# Patient Record
Sex: Male | Born: 1940
Health system: Southern US, Community
[De-identification: ages and names within clinical notes are randomized; demographics above are authoritative.]

## PROBLEM LIST (undated history)

## (undated) DIAGNOSIS — Z860100 Personal history of colon polyps, unspecified: Secondary | ICD-10-CM

## (undated) DIAGNOSIS — C4490 Unspecified malignant neoplasm of skin, unspecified: Secondary | ICD-10-CM

## (undated) DIAGNOSIS — K449 Diaphragmatic hernia without obstruction or gangrene: Secondary | ICD-10-CM

## (undated) DIAGNOSIS — K5790 Diverticulosis of intestine, part unspecified, without perforation or abscess without bleeding: Secondary | ICD-10-CM

## (undated) DIAGNOSIS — I10 Essential (primary) hypertension: Secondary | ICD-10-CM

## (undated) DIAGNOSIS — K219 Gastro-esophageal reflux disease without esophagitis: Secondary | ICD-10-CM

## (undated) DIAGNOSIS — E119 Type 2 diabetes mellitus without complications: Secondary | ICD-10-CM

## (undated) DIAGNOSIS — M519 Unspecified thoracic, thoracolumbar and lumbosacral intervertebral disc disorder: Secondary | ICD-10-CM

## (undated) DIAGNOSIS — M1990 Unspecified osteoarthritis, unspecified site: Secondary | ICD-10-CM

## (undated) DIAGNOSIS — Z789 Other specified health status: Secondary | ICD-10-CM

## (undated) DIAGNOSIS — C801 Malignant (primary) neoplasm, unspecified: Secondary | ICD-10-CM

## (undated) DIAGNOSIS — C61 Malignant neoplasm of prostate: Secondary | ICD-10-CM

## (undated) DIAGNOSIS — E785 Hyperlipidemia, unspecified: Secondary | ICD-10-CM

## (undated) DIAGNOSIS — R1310 Dysphagia, unspecified: Secondary | ICD-10-CM

## (undated) HISTORY — DX: Malignant (primary) neoplasm, unspecified: C80.1

## (undated) HISTORY — DX: Hyperlipidemia, unspecified: E78.5

## (undated) HISTORY — PX: EYE SURGERY: SHX253

## (undated) HISTORY — DX: Gastro-esophageal reflux disease without esophagitis: K21.9

## (undated) HISTORY — PX: OTHER SURGICAL HISTORY: SHX169

## (undated) HISTORY — DX: Diaphragmatic hernia without obstruction or gangrene: K44.9

## (undated) HISTORY — PX: ESOPHAGEAL DILATION: SHX303

## (undated) HISTORY — DX: Type 2 diabetes mellitus without complications: E11.9

---
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
## 1999-04-08 ENCOUNTER — Encounter: Admission: RE | Admit: 1999-04-08 | Discharge: 1999-07-07 | Payer: Self-pay | Admitting: *Deleted

## 2003-03-14 ENCOUNTER — Encounter: Admission: RE | Admit: 2003-03-14 | Discharge: 2003-03-14 | Payer: Self-pay | Admitting: Family Medicine

## 2006-01-13 HISTORY — PX: HERNIA REPAIR: SHX51

## 2008-12-15 ENCOUNTER — Ambulatory Visit (HOSPITAL_COMMUNITY): Admission: RE | Admit: 2008-12-15 | Discharge: 2008-12-15 | Payer: Self-pay | Admitting: Gastroenterology

## 2009-08-21 ENCOUNTER — Encounter: Admission: RE | Admit: 2009-08-21 | Discharge: 2009-08-21 | Payer: Self-pay | Admitting: Family Medicine

## 2011-08-15 ENCOUNTER — Emergency Department (HOSPITAL_COMMUNITY): Payer: No Typology Code available for payment source

## 2011-08-15 ENCOUNTER — Inpatient Hospital Stay (HOSPITAL_COMMUNITY)
Admission: EM | Admit: 2011-08-15 | Discharge: 2011-08-17 | DRG: 200 | Disposition: A | Discharge status: Home / Self Care | Payer: No Typology Code available for payment source | Attending: General Surgery | Admitting: General Surgery

## 2011-08-15 ENCOUNTER — Encounter (HOSPITAL_COMMUNITY): Payer: Self-pay | Admitting: *Deleted

## 2011-08-15 DIAGNOSIS — E785 Hyperlipidemia, unspecified: Secondary | ICD-10-CM | POA: Diagnosis present

## 2011-08-15 DIAGNOSIS — Y92410 Unspecified street and highway as the place of occurrence of the external cause: Secondary | ICD-10-CM

## 2011-08-15 DIAGNOSIS — S32009A Unspecified fracture of unspecified lumbar vertebra, initial encounter for closed fracture: Secondary | ICD-10-CM

## 2011-08-15 DIAGNOSIS — S270XXA Traumatic pneumothorax, initial encounter: Principal | ICD-10-CM | POA: Diagnosis present

## 2011-08-15 DIAGNOSIS — S2239XA Fracture of one rib, unspecified side, initial encounter for closed fracture: Secondary | ICD-10-CM

## 2011-08-15 DIAGNOSIS — K219 Gastro-esophageal reflux disease without esophagitis: Secondary | ICD-10-CM | POA: Diagnosis present

## 2011-08-15 DIAGNOSIS — IMO0002 Reserved for concepts with insufficient information to code with codable children: Secondary | ICD-10-CM

## 2011-08-15 DIAGNOSIS — S2249XA Multiple fractures of ribs, unspecified side, initial encounter for closed fracture: Secondary | ICD-10-CM | POA: Diagnosis present

## 2011-08-15 DIAGNOSIS — S301XXA Contusion of abdominal wall, initial encounter: Secondary | ICD-10-CM | POA: Diagnosis present

## 2011-08-15 DIAGNOSIS — J939 Pneumothorax, unspecified: Secondary | ICD-10-CM

## 2011-08-15 DIAGNOSIS — Z9089 Acquired absence of other organs: Secondary | ICD-10-CM

## 2011-08-15 DIAGNOSIS — M549 Dorsalgia, unspecified: Secondary | ICD-10-CM

## 2011-08-15 DIAGNOSIS — T07XXXA Unspecified multiple injuries, initial encounter: Secondary | ICD-10-CM

## 2011-08-15 DIAGNOSIS — I10 Essential (primary) hypertension: Secondary | ICD-10-CM | POA: Diagnosis present

## 2011-08-15 HISTORY — DX: Other specified health status: Z78.9

## 2011-08-15 HISTORY — DX: Essential (primary) hypertension: I10

## 2011-08-15 LAB — COMPREHENSIVE METABOLIC PANEL
AST: 41 U/L — ABNORMAL HIGH (ref 0–37)
Alkaline Phosphatase: 52 U/L (ref 39–117)
BUN: 17 mg/dL (ref 6–23)
CO2: 22 mEq/L (ref 19–32)
Glucose, Bld: 146 mg/dL — ABNORMAL HIGH (ref 70–99)
Total Bilirubin: 1.4 mg/dL — ABNORMAL HIGH (ref 0.3–1.2)
Total Protein: 6.8 g/dL (ref 6.0–8.3)

## 2011-08-15 LAB — POCT I-STAT, CHEM 8
Calcium, Ion: 1.16 mmol/L (ref 1.13–1.30)
Glucose, Bld: 144 mg/dL — ABNORMAL HIGH (ref 70–99)
Potassium: 3.9 mEq/L (ref 3.5–5.1)
Sodium: 141 mEq/L (ref 135–145)
TCO2: 21 mmol/L (ref 0–100)

## 2011-08-15 LAB — CBC
MCH: 30.1 pg (ref 26.0–34.0)
MCHC: 35.7 g/dL (ref 30.0–36.0)
Platelets: 211 10*3/uL (ref 150–400)
RBC: 5.01 MIL/uL (ref 4.22–5.81)

## 2011-08-15 LAB — URINALYSIS, MICROSCOPIC ONLY
Glucose, UA: NEGATIVE mg/dL
Hgb urine dipstick: NEGATIVE
Specific Gravity, Urine: 1.022 (ref 1.005–1.030)

## 2011-08-15 LAB — SAMPLE TO BLOOD BANK

## 2011-08-15 LAB — PROTIME-INR: INR: 1.02 (ref 0.00–1.49)

## 2011-08-15 MED ORDER — ONDANSETRON HCL 4 MG/2ML IJ SOLN
INTRAMUSCULAR | Status: AC
  Administered 2011-08-15: 4 mg
  Filled 2011-08-15: qty 2

## 2011-08-15 MED ORDER — OXYCODONE-ACETAMINOPHEN 5-325 MG PO TABS: 2.0000 | ORAL_TABLET | ORAL | Status: DC | PRN

## 2011-08-15 MED ORDER — MORPHINE SULFATE 2 MG/ML IJ SOLN
2.0000 mg | INTRAMUSCULAR | Status: DC | PRN
  Administered 2011-08-15: 2 mg via INTRAVENOUS
  Filled 2011-08-15: qty 1

## 2011-08-15 MED ORDER — OXYCODONE-ACETAMINOPHEN 5-325 MG PO TABS: 1.0000 | ORAL_TABLET | Freq: Once | ORAL | Status: DC

## 2011-08-15 MED ORDER — SODIUM CHLORIDE 0.9 % IV BOLUS (SEPSIS)
1000.0000 mL | Freq: Once | INTRAVENOUS | Status: AC
  Administered 2011-08-15: 1000 mL via INTRAVENOUS

## 2011-08-15 MED ORDER — IOHEXOL 300 MG/ML  SOLN
100.0000 mL | Freq: Once | INTRAMUSCULAR | Status: AC | PRN
  Administered 2011-08-15: 100 mL via INTRAVENOUS

## 2011-08-15 MED ORDER — PANTOPRAZOLE SODIUM 40 MG PO TBEC
40.0000 mg | DELAYED_RELEASE_TABLET | Freq: Every day | ORAL | Status: DC
  Administered 2011-08-16 – 2011-08-17 (×2): 40 mg via ORAL
  Filled 2011-08-15 (×2): qty 1

## 2011-08-15 MED ORDER — SODIUM CHLORIDE 0.9 % IV SOLN
INTRAVENOUS | Status: DC
  Administered 2011-08-15: 21:00:00 via INTRAVENOUS
  Administered 2011-08-16: 50 mL/h via INTRAVENOUS
  Administered 2011-08-17: 03:00:00 via INTRAVENOUS

## 2011-08-15 MED ORDER — OXYCODONE HCL 5 MG PO TABS
5.0000 mg | ORAL_TABLET | ORAL | Status: DC | PRN
  Administered 2011-08-16 – 2011-08-17 (×8): 5 mg via ORAL
  Filled 2011-08-15 (×8): qty 1

## 2011-08-15 NOTE — ED Notes (Signed)
Removed from backboard via Dr. Nino Parsley.

## 2011-08-15 NOTE — ED Notes (Signed)
Pt ready for d/c stood up to use urinal felt dizzy assisted back to bed bp 86/62 iv placed bolus started liter NS Dr Rosalia Hammers at bedside Percocet on hold

## 2011-08-15 NOTE — ED Notes (Signed)
Patient transported to X-ray 

## 2011-08-15 NOTE — ED Notes (Signed)
Pt out of Ed with [facility].

## 2011-08-15 NOTE — ED Notes (Signed)
Large hematoma noted on rt flank painful to the touch

## 2011-08-15 NOTE — ED Notes (Signed)
Patient transported to CT 

## 2011-08-15 NOTE — ED Notes (Signed)
Surgery at bedside.
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
## 2011-08-15 NOTE — ED Notes (Signed)
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 [facility]. Pt awaiting transfer. Alert x 4 v/s stable. 1 of morphine given for pain will monitor.
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
## 2011-08-15 NOTE — Progress Notes (Signed)
MD notified that pt has arrived to the unit with bp 141/60 and informed of room number and that pt has only fluids and pain medication. Ilean Skill LPN

## 2011-08-15 NOTE — ED Notes (Signed)
Attempted to call report to [address]. Rn unavailable will continue to monitor.
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
## 2011-08-15 NOTE — ED Notes (Signed)
[facility] in house to transport pt to [facility].  

## 2011-08-16 ENCOUNTER — Inpatient Hospital Stay (HOSPITAL_COMMUNITY): Payer: No Typology Code available for payment source

## 2011-08-16 LAB — CBC WITH DIFFERENTIAL/PLATELET
Eosinophils Absolute: 0.1 10*3/uL (ref 0.0–0.7)
Eosinophils Relative: 1 % (ref 0–5)
HCT: 39.1 % (ref 39.0–52.0)
Hemoglobin: 13.6 g/dL (ref 13.0–17.0)
Lymphocytes Relative: 14 % (ref 12–46)
Lymphs Abs: 1.2 10*3/uL (ref 0.7–4.0)
MCH: 30.1 pg (ref 26.0–34.0)
MCV: 86.5 fL (ref 78.0–100.0)
Monocytes Absolute: 1.1 10*3/uL — ABNORMAL HIGH (ref 0.1–1.0)
Monocytes Relative: 12 % (ref 3–12)
RBC: 4.52 MIL/uL (ref 4.22–5.81)

## 2011-08-16 MED ORDER — BACITRACIN-NEOMYCIN-POLYMYXIN 400-5-5000 EX OINT
TOPICAL_OINTMENT | CUTANEOUS | Status: AC
  Filled 2011-08-16: qty 1

## 2011-08-16 MED ORDER — DOCUSATE SODIUM 100 MG PO CAPS
100.0000 mg | ORAL_CAPSULE | Freq: Two times a day (BID) | ORAL | Status: DC
  Administered 2011-08-16 – 2011-08-17 (×3): 100 mg via ORAL
  Filled 2011-08-16 (×3): qty 1
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
## 2011-08-17 LAB — CBC WITH DIFFERENTIAL/PLATELET
Basophils Absolute: 0 10*3/uL (ref 0.0–0.1)
Eosinophils Absolute: 0.1 10*3/uL (ref 0.0–0.7)
Eosinophils Relative: 1 % (ref 0–5)
HCT: 36.7 % — ABNORMAL LOW (ref 39.0–52.0)
Lymphs Abs: 1.3 10*3/uL (ref 0.7–4.0)
MCH: 29.6 pg (ref 26.0–34.0)
MCV: 86.8 fL (ref 78.0–100.0)
Monocytes Absolute: 1.3 10*3/uL — ABNORMAL HIGH (ref 0.1–1.0)
Platelets: 175 10*3/uL (ref 150–400)
RDW: 13.3 % (ref 11.5–15.5)

## 2011-08-17 MED ORDER — OXYCODONE-ACETAMINOPHEN 5-325 MG PO TABS: 2.0000 | ORAL_TABLET | ORAL | Status: DC | PRN

## 2011-08-17 MED ORDER — DSS 100 MG PO CAPS: 100.0000 mg | ORAL_CAPSULE | Freq: Two times a day (BID) | ORAL | Status: DC

## 2011-08-17 MED ORDER — BACITRACIN-NEOMYCIN-POLYMYXIN 400-5-5000 EX OINT
TOPICAL_OINTMENT | CUTANEOUS | Status: AC
  Filled 2011-08-17: qty 2
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
## 2011-08-19 ENCOUNTER — Telehealth: Payer: Self-pay | Admitting: Orthopedic Surgery

## 2011-08-19 NOTE — Telephone Encounter (Signed)
Confirmed appt on 8/15 @ 2:00.

## 2011-08-28 ENCOUNTER — Ambulatory Visit (INDEPENDENT_AMBULATORY_CARE_PROVIDER_SITE_OTHER): Payer: Medicare PPO | Admitting: Orthopedic Surgery

## 2011-08-28 ENCOUNTER — Encounter (INDEPENDENT_AMBULATORY_CARE_PROVIDER_SITE_OTHER): Payer: Self-pay

## 2011-08-28 VITALS — BP 124/68 | HR 70 | Temp 97.6°F | Resp 16 | Ht 70.0 in | Wt 243.5 lb

## 2011-08-28 DIAGNOSIS — S50819A Abrasion of unspecified forearm, initial encounter: Secondary | ICD-10-CM

## 2011-08-28 DIAGNOSIS — S2239XA Fracture of one rib, unspecified side, initial encounter for closed fracture: Secondary | ICD-10-CM

## 2011-08-28 DIAGNOSIS — IMO0002 Reserved for concepts with insufficient information to code with codable children: Secondary | ICD-10-CM

## 2011-08-28 NOTE — Patient Instructions (Signed)
You may increase activity as tolerated.  I would suggest increasing your ibuprofen to 800mg  three times daily with meals for 4 weeks.  Continue washing your abrasions in the shower with soap and water and applying antibiotic ointment twice daily and as needed to keep moist.
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
## 2013-03-18 ENCOUNTER — Encounter: Payer: Medicare FFS | Attending: Family Medicine

## 2013-03-18 VITALS — Ht 71.0 in | Wt 235.0 lb

## 2013-03-18 DIAGNOSIS — E119 Type 2 diabetes mellitus without complications: Secondary | ICD-10-CM

## 2013-03-18 DIAGNOSIS — Z713 Dietary counseling and surveillance: Secondary | ICD-10-CM | POA: Insufficient documentation

## 2013-03-21 NOTE — Progress Notes (Signed)
Patient was seen on 03/18/13 for the first of a series of three diabetes self-management courses at the [facility].  Current HbA1c: 7.2%  The following learning objectives were met by the patient during this class:  Describe diabetes  State some common risk factors for diabetes  Defines the role of glucose and insulin  Identifies type of diabetes and pathophysiology  Describe the relationship between diabetes and cardiovascular risk  State the members of the Healthcare Team  States the rationale for glucose monitoring  State when to test glucose  State their individual Target Range  State the importance of logging glucose readings  Describe how to interpret glucose readings  Identifies A1C target  Explain the correlation between A1c and eAG values  State symptoms and treatment of high blood glucose  State symptoms and treatment of low blood glucose  Explain proper technique for glucose testing  Identifies proper sharps disposal  Handouts given during class include:  Living Well with Diabetes book  Carb Counting and Meal Planning book  Meal Plan Card  Carbohydrate guide  Meal planning worksheet  Low Sodium Flavoring Tips  The diabetes portion plate  V4O to eAG Conversion Chart  Diabetes Medications  Diabetes Recommended Care Schedule  Support Group  Diabetes Success Plan  Core Class Satisfaction Survey  Follow-Up Plan:  Attend core 2

## 2013-03-25 ENCOUNTER — Ambulatory Visit: Payer: Self-pay

## 2013-04-01 DIAGNOSIS — E119 Type 2 diabetes mellitus without complications: Secondary | ICD-10-CM

## 2013-04-04 NOTE — Progress Notes (Signed)
Patient was seen on 3/10 for the third of a series of three diabetes self-management courses at the [facility]. The following learning objectives were met by the patient during this class:    State the amount of activity recommended for healthy living   Describe activities suitable for individual needs   Identify ways to regularly incorporate activity into daily life   Identify barriers to activity and ways to over come these barriers  Identify diabetes medications being personally used and their primary action for lowering glucose and possible side effects   Describe role of stress on blood glucose and develop strategies to address psychosocial issues   Identify diabetes complications and ways to prevent them  Explain how to manage diabetes during illness   Evaluate success in meeting personal goal   Establish 2-3 goals that they will plan to diligently work on until they return for the  2-monthfollow-up visit  Goals:  Follow Diabetes Meal Plan as instructed  Aim for 15-30 mins of physical activity daily as tolerated  Bring food record and glucose log to your follow up visit  Your patient has established the following 4 month goals in their individualized success plan: I will count my carb choices at most meals and snacks I will increase my activity level at least 2 days a week I will look at patterns in my record book daily  Lose more weight and increase activity  Your patient has identified these potential barriers to change:  None noted  Your patient has identified their diabetes self-care support plan as  [facility]  Spouse

## 2013-04-05 ENCOUNTER — Ambulatory Visit: Payer: Medicare Other

## 2013-04-07 DIAGNOSIS — E119 Type 2 diabetes mellitus without complications: Secondary | ICD-10-CM

## 2013-04-07 NOTE — Progress Notes (Signed)
Patient was seen on 04/07/13 for the second of a series of three diabetes self-management courses at the [facility]. The following learning objectives were met by the patient during this class:   Describe the role of different macronutrients on glucose  Explain how carbohydrates affect blood glucose  State what foods contain the most carbohydrates  Demonstrate carbohydrate counting  Demonstrate how to read Nutrition Facts food label  Describe effects of various fats on heart health  Describe the importance of good nutrition for health and healthy eating strategies  Describe techniques for managing your shopping, cooking and meal planning  List strategies to follow meal plan when dining out  Describe the effects of alcohol on glucose and how to use it safely  Goals:  Follow Diabetes Meal Plan as instructed  Eat 3 meals and 2 snacks, every 3-5 hrs  Aim for carbohydrate intake to 45-60 grams carbohydrate/meal Aim for carbohydrate intake to 15-30 grams carbohydrate/snack Add lean protein foods to meals/snacks  Monitor glucose levels as instructed by your doctor   Follow-Up Plan:  Attend Core 3  Work towards following your personal food plan.

## 2013-07-18 ENCOUNTER — Encounter: Payer: Medicare FFS | Attending: Family Medicine

## 2013-07-18 DIAGNOSIS — Z713 Dietary counseling and surveillance: Secondary | ICD-10-CM | POA: Insufficient documentation

## 2013-07-18 DIAGNOSIS — E118 Type 2 diabetes mellitus with unspecified complications: Secondary | ICD-10-CM | POA: Diagnosis not present

## 2013-07-18 NOTE — Progress Notes (Signed)
Patient was seen on 07/18/2013 for a review of the series of three diabetes self-management courses at the [facility]. The following learning objectives were met by the patient during this class:    Reviewed blood glucose monitoring and interpretation including the recommended target ranges and Hgb A1c.    Reviewed on carb counting, importance of regularly scheduled meals/snacks, and meal planning.    Reviewed the effects of physical activity on glucose levels and long-term glucose control.  Recommended goal of 150 minutes of physical activity/week.   Reviewed patient medications and discussed role of medication on blood glucose and possible side effects.   Discussed strategies to manage stress, psychosocial issues, and other obstacles to diabetes management.   Encouraged moderate weight reduction to improve glucose levels.     Reviewed short-term complications: hyper- and hypo-glycemia.  Discussed causes, symptoms, and treatment options.   Reviewed prevention, detection, and treatment of long-term complications.  Discussed the role of prolonged elevated glucose levels on body systems.  Goals:  Follow Diabetes Meal Plan as instructed  Eat 3 meals and 2 snacks, every 3-5 hrs  Limit carbohydrate intake to 45 grams carbohydrate/meal Limit carbohydrate intake to 15 grams carbohydrate/snack Add lean protein foods to meals/snacks  Monitor glucose levels as instructed by your doctor  Aim for goal of 15-30 mins of physical activity daily as tolerated  Bring food record and glucose log to your next nutrition visit   

## 2013-12-02 DIAGNOSIS — C61 Malignant neoplasm of prostate: Secondary | ICD-10-CM

## 2013-12-02 HISTORY — DX: Malignant neoplasm of prostate: C61

## 2013-12-02 HISTORY — PX: PROSTATE BIOPSY: SHX241

## 2013-12-12 ENCOUNTER — Other Ambulatory Visit (HOSPITAL_COMMUNITY): Payer: Self-pay | Admitting: Urology

## 2013-12-12 DIAGNOSIS — C61 Malignant neoplasm of prostate: Secondary | ICD-10-CM

## 2013-12-14 ENCOUNTER — Other Ambulatory Visit (HOSPITAL_COMMUNITY): Payer: Self-pay | Admitting: Urology

## 2013-12-14 DIAGNOSIS — C61 Malignant neoplasm of prostate: Secondary | ICD-10-CM

## 2013-12-15 ENCOUNTER — Encounter (HOSPITAL_COMMUNITY): Payer: Self-pay

## 2013-12-15 ENCOUNTER — Encounter (HOSPITAL_COMMUNITY)
Admission: RE | Admit: 2013-12-15 | Discharge: 2013-12-15 | Disposition: A | Discharge status: Home / Self Care | Payer: Medicare FFS | Source: Ambulatory Visit | Attending: Urology | Admitting: Urology

## 2013-12-15 DIAGNOSIS — M419 Scoliosis, unspecified: Secondary | ICD-10-CM | POA: Insufficient documentation

## 2013-12-15 DIAGNOSIS — C61 Malignant neoplasm of prostate: Secondary | ICD-10-CM | POA: Insufficient documentation

## 2013-12-15 MED ORDER — TECHNETIUM TC 99M MEDRONATE IV KIT
26.5000 | PACK | Freq: Once | INTRAVENOUS | Status: AC | PRN
  Administered 2013-12-15: 26.5 via INTRAVENOUS

## 2013-12-27 ENCOUNTER — Telehealth: Payer: Self-pay | Admitting: Medical Oncology

## 2013-12-27 NOTE — Telephone Encounter (Signed)
I called pt to introduce myself as the Prostate Nurse Navigator and the Coordinator of the [facility] [place].  1. I confirmed with the patient he is aware of his referral to the clinic and his appointment 01/03/14. He is to arrive at 12:30.  2. I discussed the format of the clinic and the physicians he will be seeing that day.  3. I discussed where the clinic is located and how to contact me.  4. I confirmed his address and informed him I would be mailing a packet of information and forms to be completed. I asked him to bring them with him the day of his appointment. I also asked the patient to eat some lunch before he comes due to the clinic time and amount of time he will be here.   He voiced understanding of the above. I asked him to call me if he has any questions or concerns regarding his appointments or the forms he needs to complete.   Cira Rue, RN, BSN, CRNI Prostate Nurse [place] Office: [phone] Fax: [phone]

## 2013-12-30 ENCOUNTER — Encounter: Payer: Self-pay | Admitting: Radiation Oncology

## 2013-12-30 NOTE — Progress Notes (Signed)
GU Location of Tumor / Histology: prostate adenocarcinoma  If Prostate Cancer, Gleason Score is (4 + 4) and PSA is (10.35 on 10/07/13)  Victor Goodman presented 2 months ago with signs/symptoms of: persistently elevated PSA of just over 10, urinary frequency, urgency, nocturia,  weak stream, ED  Biopsies of  prostate revealed:  12/02/13  Volume 76 gms   Past/Anticipated interventions by urology, if any: biopsy  Past/Anticipated interventions by medical oncology, if any: none , will be seen by Dr Alen Blew in prostate [place] today- 01/03/14  Weight changes, if any:   Bowel/Bladder complaints, if any:    Nausea/Vomiting, if any:   Pain issues, if any:    SAFETY ISSUES:  Prior radiation? no  Pacemaker/ICD? no  Possible current pregnancy? na  Is the patient on methotrexate? no  Current Complaints / other details:  Married, retired Office manager , 3 children

## 2014-01-02 ENCOUNTER — Telehealth: Payer: Self-pay | Admitting: Medical Oncology

## 2014-01-02 NOTE — Telephone Encounter (Signed)
I called pt to confirm his appointment for the [facility]  01/03/14 with arrival at 12:30pm. I asked him to bring his complete medical forms and to eat lunch. We reviewed where the cancer center is located and about  the [place] parking. I asked him to call me if he has any questions or concerns about his appointment tomorrow. He voiced understanding.       Cira Rue, RN, BSN, [place]  [phone]  Fax [phone]

## 2014-01-03 ENCOUNTER — Encounter: Payer: Self-pay | Admitting: Radiation Oncology

## 2014-01-03 ENCOUNTER — Encounter: Payer: Self-pay | Admitting: Medical Oncology

## 2014-01-03 ENCOUNTER — Encounter: Payer: Self-pay | Admitting: Specialist

## 2014-01-03 ENCOUNTER — Ambulatory Visit (HOSPITAL_BASED_OUTPATIENT_CLINIC_OR_DEPARTMENT_OTHER): Payer: Medicare FFS | Admitting: Oncology

## 2014-01-03 ENCOUNTER — Ambulatory Visit
Admission: RE | Admit: 2014-01-03 | Discharge: 2014-01-03 | Disposition: A | Discharge status: Home / Self Care | Payer: Medicare FFS | Source: Ambulatory Visit | Attending: Radiation Oncology | Admitting: Radiation Oncology

## 2014-01-03 VITALS — BP 138/71 | HR 87 | Temp 98.0°F | Resp 16 | Ht 71.0 in | Wt 249.7 lb

## 2014-01-03 DIAGNOSIS — E119 Type 2 diabetes mellitus without complications: Secondary | ICD-10-CM | POA: Diagnosis not present

## 2014-01-03 DIAGNOSIS — R599 Enlarged lymph nodes, unspecified: Secondary | ICD-10-CM | POA: Insufficient documentation

## 2014-01-03 DIAGNOSIS — Z86010 Personal history of colonic polyps: Secondary | ICD-10-CM | POA: Insufficient documentation

## 2014-01-03 DIAGNOSIS — R1310 Dysphagia, unspecified: Secondary | ICD-10-CM | POA: Diagnosis not present

## 2014-01-03 DIAGNOSIS — K449 Diaphragmatic hernia without obstruction or gangrene: Secondary | ICD-10-CM | POA: Insufficient documentation

## 2014-01-03 DIAGNOSIS — C61 Malignant neoplasm of prostate: Secondary | ICD-10-CM

## 2014-01-03 DIAGNOSIS — I10 Essential (primary) hypertension: Secondary | ICD-10-CM | POA: Insufficient documentation

## 2014-01-03 DIAGNOSIS — Z85828 Personal history of other malignant neoplasm of skin: Secondary | ICD-10-CM | POA: Diagnosis not present

## 2014-01-03 DIAGNOSIS — M1990 Unspecified osteoarthritis, unspecified site: Secondary | ICD-10-CM | POA: Insufficient documentation

## 2014-01-03 DIAGNOSIS — K219 Gastro-esophageal reflux disease without esophagitis: Secondary | ICD-10-CM | POA: Diagnosis not present

## 2014-01-03 DIAGNOSIS — Z79899 Other long term (current) drug therapy: Secondary | ICD-10-CM | POA: Diagnosis not present

## 2014-01-03 DIAGNOSIS — Z7982 Long term (current) use of aspirin: Secondary | ICD-10-CM | POA: Insufficient documentation

## 2014-01-03 DIAGNOSIS — K5790 Diverticulosis of intestine, part unspecified, without perforation or abscess without bleeding: Secondary | ICD-10-CM | POA: Insufficient documentation

## 2014-01-03 DIAGNOSIS — E785 Hyperlipidemia, unspecified: Secondary | ICD-10-CM | POA: Insufficient documentation

## 2014-01-03 DIAGNOSIS — R591 Generalized enlarged lymph nodes: Secondary | ICD-10-CM

## 2014-01-03 HISTORY — DX: Unspecified malignant neoplasm of skin, unspecified: C44.90

## 2014-01-03 HISTORY — DX: Unspecified thoracic, thoracolumbar and lumbosacral intervertebral disc disorder: M51.9

## 2014-01-03 HISTORY — DX: Diverticulosis of intestine, part unspecified, without perforation or abscess without bleeding: K57.90

## 2014-01-03 HISTORY — DX: Personal history of colon polyps, unspecified: Z86.0100

## 2014-01-03 HISTORY — DX: Malignant neoplasm of prostate: C61

## 2014-01-03 HISTORY — DX: Personal history of colonic polyps: Z86.010

## 2014-01-03 HISTORY — DX: Dysphagia, unspecified: R13.10

## 2014-01-03 HISTORY — DX: Unspecified osteoarthritis, unspecified site: M19.90

## 2014-01-03 NOTE — Progress Notes (Signed)
Patient reports leg pain and cramping. Patient wears glasses. Reports dribbling urine. Reports back pain. Denies hx of xrt. Denies having a pacemaker.

## 2014-01-03 NOTE — Consult Note (Signed)
Reason for Referral: Prostate cancer.  HPI: 73 year old man native of [place] where he lived the majority of his life. He has s a history of hypertension, GERD and obesity. He was found to have an elevated PSA in routine examination. His PSA was 10.35 and was referred to Dr. Risa Grill and underwent a prostate biopsy on 12/02/2013. That showed a Gleason score 4 + 4 = 8 in most out of 11/12 cores. There are few Gleason 7 and 6 patterns. His DRE was normal. Staging work up including a bone scan was normal. CT scan on 12/14/2013 showed possible EPE with subtle area in left side of the prostate. Multiple borderline enlarged lymph nodes noted in the pelvic area. The largest of which was around 1.8 cm. More extensive involvement in the internal iliac node area.   Clinically, He is asymptomatic. He reports no fevers, chils or sweats. No weight loss or apatite changes. He reports no headaches, blurry vision or seizures. He has no chest pain, palpitation or leg edema. No cough, no sob on activity. No wheezing or hemoptysis. He does not report any nausea, vomiting, abdominal pain or early satiety. He does report nocturia and frequent urination. He does not report any skeletal complaints. He does not report any arthralgias or myalgias. Rest of his review of systems unremarkable.   Past Medical History  Diagnosis Date  . No pertinent past medical history   . Hypertension   . GERD (gastroesophageal reflux disease)   . Hyperlipidemia   . Cancer     basal carcinoma  . Hiatal hernia   . Diabetes mellitus without complication   . Prostate cancer 12/02/13    Gleason 8, 11/12 cores positive, volume 76 gms   . Diverticulosis   . Dysphagia   . Arthritis   . Hx of colonic polyps   . Ruptured disk     hx  . Skin cancer 2010, 2014    non melanoma  :  Past Surgical History  Procedure Laterality Date  . Other surgical history  2011,2010    cataract surgery  . Appendectomy  1962  . Eye surgery  2009, 2010   cataract  . Hernia repair  2008    hiatal hernia  . Tonsillectomy  1947  . Prostate biopsy  12/02/13    Gleason 8, 11/12 cores positive, volume 76 gms  . Esophageal dilation    :  Current Outpatient Prescriptions  Medication Sig Dispense Refill  . aspirin 325 MG tablet Take 325 mg by mouth daily.    Marland Kitchen atorvastatin (LIPITOR) 20 MG tablet Take 20 mg by mouth daily.    . Cholecalciferol (VITAMIN D3) 2000 UNITS TABS Take by mouth.    Marland Kitchen ibuprofen (ADVIL,MOTRIN) 200 MG tablet Take 400 mg by mouth every 6 (six) hours as needed.    Marland Kitchen losartan-hydrochlorothiazide (HYZAAR) 100-12.5 MG per tablet Take 1 tablet by mouth daily.    . Multiple Vitamin (MULTIVITAMIN WITH MINERALS) TABS Take 1 tablet by mouth daily.    . Omega-3 Fatty Acids (FISH OIL) 1000 MG CPDR Take by mouth.    Marland Kitchen omeprazole (PRILOSEC) 40 MG capsule Take 40 mg by mouth daily.    . Selenium 200 MCG CAPS Take by mouth.    . tamsulosin (FLOMAX) 0.4 MG CAPS capsule Take 0.4 mg by mouth.    . zinc gluconate 50 MG tablet Take 50 mg by mouth daily.     No current facility-administered medications for this visit.  No Known Allergies:  Family History  Problem Relation Age of Onset  . Cancer Mother 62    bone  . Alzheimer's disease Father   :  History   Social History  . Marital Status: Married    Spouse Name: N/A    Number of Children: N/A  . Years of Education: N/A   Occupational History  . Not on file.   Social History Main Topics  . Smoking status: Never Smoker   . Smokeless tobacco: Never Used  . Alcohol Use: 0.6 oz/week    1 Glasses of wine per week     Comment: glass of red wine per day  . Drug Use: No  . Sexual Activity: No   Other Topics Concern  . Not on file   Social History Narrative  :  Pertinent items are noted in HPI.  Exam: ECOG 0 There were no vitals taken for this visit. General appearance: alert and cooperative Throat: lips, mucosa, and tongue normal; teeth and gums normal Neck:  no adenopathy Back: symmetric, no curvature. ROM normal. No CVA tenderness. Resp: clear to auscultation bilaterally Chest wall: no tenderness Cardio: regular rate and rhythm, S1, S2 normal, no murmur, click, rub or gallop GI: soft, non-tender; bowel sounds normal; no masses,  no organomegaly Extremities: extremities normal, atraumatic, no cyanosis or edema Skin: Skin color, texture, turgor normal. No rashes or lesions  CBC    Component Value Date/Time   WBC 9.9 08/17/2011 0550   RBC 4.23 08/17/2011 0550   HGB 12.5* 08/17/2011 0550   HCT 36.7* 08/17/2011 0550   PLT 175 08/17/2011 0550   MCV 86.8 08/17/2011 0550   MCH 29.6 08/17/2011 0550   MCHC 34.1 08/17/2011 0550   RDW 13.3 08/17/2011 0550   LYMPHSABS 1.3 08/17/2011 0550   MONOABS 1.3* 08/17/2011 0550   EOSABS 0.1 08/17/2011 0550   BASOSABS 0.0 08/17/2011 0550      Nm Bone Scan Whole Body  12/15/2013   CLINICAL DATA:  Prostate cancer.  EXAM: NUCLEAR MEDICINE WHOLE BODY BONE SCAN  TECHNIQUE: Whole body anterior and posterior images were obtained approximately 3 hours after intravenous injection of radiopharmaceutical.  RADIOPHARMACEUTICALS:  26.5 mCi Technetium-99 MDP  COMPARISON:  CT 12/15/2013.  FINDINGS: Bilateral renal function and excretion. Mild lumbar spine scoliosis noted. Minimal prominence of activity in thoracolumbar spine. Although subtle bony metastatic disease cannot be excluded, these changes are most likely degenerative. No other focal bony abnormality identified.  IMPRESSION: 1. Scoliosis lumbar spine. Mild prominence activity noted in the thoracolumbar spine. Although metastatic disease cannot be excluded. These changes are most likely degenerative. 2. No other focal abnormality noted suggest metastatic disease.   Electronically Signed   By: Marcello Moores  Register   On: 12/15/2013 11:57    Assessment and Plan:   73 year old gentleman with diagnosis of prostate cancer after presenting with a PSA of 10.38 and a biopsy  indicating a Gleason score of 4+4 = 8 and 11 out of 12 cores. He did have a tertiary patterns of 6 and 7. His imaging studies were reviewed today with radiology and appears to have possible extraprostatic extension and pelvic adenopathy. The natural course of this disease was discussed today with the patient and his wife after discussion in the prostate cancer multidisciplinary clinic. It is felt that he has advanced disease with pelvic adenopathy and high risk Gleason score. Options of treatment were discussed today which include long-term androgen deprivation and certainly consideration to add radiation therapy. Given his age, his  performance status is reasonable to consider that option and give him a potential curative possibility. He understands the likelihood of cure is slim but certainly worth the attempt at this time.  I discussed with him the role of systemic chemotherapy especially in the setting of pelvic adenopathy. At this time, I feel chemotherapy will is rather marginal in the setting of non-bulky pelvic adenopathy. The risks and benefits were discussed extensively and is felt the risks do not justify the marginal benefit at this time.  All his questions were answered today he is agreeable with long-term androgen deprivation and possibly radiation therapy.

## 2014-01-03 NOTE — Progress Notes (Signed)
Please see consult note.  

## 2014-01-03 NOTE — Progress Notes (Signed)
Radiation Oncology         [phone] ________________________________  Multidisciplinary Prostate Cancer Clinic  Initial Radiation Oncology Consultation  Name: Victor Goodman MRN: [id number]  Date: 01/03/2014  DOB: September 05, 1940  XL:[id number],VOZDGU Victor Sers, MD  Victor Bring, MD   REFERRING PHYSICIAN: [name], MD  DIAGNOSIS: 73 y.o. gentleman with stage T1c adenocarcinoma of the prostate with a Gleason's score of 4+4 and a PSA of 10.35    ICD-9-CM ICD-10-CM   1. Malignant neoplasm of prostate [name] is a 73 y.o. gentleman.  He was noted to have an elevated PSA of 10.35 by his primary care physician, Dr. Laurann Goodman.  Accordingly, he was referred for evaluation in urology by Dr. Risa Goodman and  digital rectal examination was performed at that time revealing no nodule or abnormality.  The patient proceeded to transrectal ultrasound with 12 biopsies of the prostate on 12/02/13.  The prostate volume measured 76 cc.  Out of 12 core biopsies, 11 were positive.  The maximum Gleason score was 4+4.  The patient reviewed the biopsy results with his urologist.  He subsequently underwent bone scan and pelvic CT for staging purposes. The bone scan showed no bone metastases. The pelvic CT did show enlarged lymphadenopathy within the pelvis extending up to the level of the confluence of the internal and external iliac veins on the right.  He reviewed the staging workup with urology and has kindly been referred today to the multidisciplinary prostate cancer clinic for presentation of pathology and radiology studies in our conference for discussion of potential radiation treatment options and clinical evaluation.    PREVIOUS RADIATION THERAPY: No  PAST MEDICAL HISTORY:  has a past medical history of No pertinent past medical history; Hypertension; GERD (gastroesophageal reflux disease); Hyperlipidemia; Cancer; Hiatal hernia; Diabetes mellitus without  complication; Prostate cancer (12/02/13); Diverticulosis; Dysphagia; Arthritis; colonic polyps; Ruptured disk; and Skin cancer (2010, 2014).    PAST SURGICAL HISTORY: Past Surgical History  Procedure Laterality Date  . Other surgical history  2011,2010    cataract surgery  . Appendectomy  1962  . Eye surgery  2009, 2010    cataract  . Hernia repair  2008    hiatal hernia  . Tonsillectomy  1947  . Prostate biopsy  12/02/13    Gleason 8, 11/12 cores positive, volume 76 gms  . Esophageal dilation      FAMILY HISTORY: family history includes Alzheimer's disease in his father; Cancer (age of onset: 49) in his mother.  SOCIAL HISTORY:  reports that he has never smoked. He has never used smokeless tobacco. He reports that he drinks about 0.6 oz of alcohol per week. He reports that he does not use illicit drugs.  ALLERGIES: Review of patient's allergies indicates no known allergies.  MEDICATIONS:  Current Outpatient Prescriptions  Medication Sig Dispense Refill  . aspirin 325 MG tablet Take 325 mg by mouth daily.    Marland Kitchen atorvastatin (LIPITOR) 20 MG tablet Take 20 mg by mouth daily.    . Cholecalciferol (VITAMIN D3) 2000 UNITS TABS Take by mouth.    Marland Kitchen ibuprofen (ADVIL,MOTRIN) 200 MG tablet Take 400 mg by mouth every 6 (six) hours as needed.    Marland Kitchen losartan-hydrochlorothiazide (HYZAAR) 100-12.5 MG per tablet Take 1 tablet by mouth daily.    . Multiple Vitamin (MULTIVITAMIN WITH MINERALS) TABS Take 1 tablet by mouth daily.    . Omega-3 Fatty Acids (FISH OIL) 1000 MG CPDR Take by mouth.    Marland Kitchen  omeprazole (PRILOSEC) 40 MG capsule Take 40 mg by mouth daily.    . Selenium 200 MCG CAPS Take by mouth.    . zinc gluconate 50 MG tablet Take 50 mg by mouth daily.     No current facility-administered medications for this encounter.    REVIEW OF SYSTEMS:  A 15 point review of systems is documented in the electronic medical record. This was obtained by the nursing staff. However, I reviewed this with the  patient to discuss relevant findings and make appropriate changes.  A comprehensive review of systems was negative..  The patient completed an IPSS and IIEF questionnaire.     PHYSICAL EXAM: This patient is in no acute distress.  He is alert and oriented.   height is 5\' 11"  (1.803 m) and weight is 249 lb 11.2 oz (113.263 kg). His oral temperature is 98 F (36.7 C). His blood pressure is 138/71 and his pulse is 87. His respiration is 16 and oxygen saturation is 100%.  He exhibits no respiratory distress or labored breathing.  He appears neurologically intact.  His mood is pleasant.  His affect is appropriate.  Please note the digital rectal exam findings described above.  KPS = 100  100 - Normal; no complaints; no evidence of disease. 90   - Able to carry on normal activity; minor signs or symptoms of disease. 80   - Normal activity with effort; some signs or symptoms of disease. 77   - Cares for self; unable to carry on normal activity or to do active work. 60   - Requires occasional assistance, but is able to care for most of his personal needs. 50   - Requires considerable assistance and frequent medical care. 14   - Disabled; requires special care and assistance. 13   - Severely disabled; hospital admission is indicated although death not imminent. 73   - Very sick; hospital admission necessary; active supportive treatment necessary. 10   - Moribund; fatal processes progressing rapidly. 0     - Dead  Karnofsky DA, Abelmann [place], Craver LS and Burchenal [facility] [phone]) The use of the nitrogen mustards in the palliative treatment of carcinoma: with particular reference to bronchogenic carcinoma Cancer 1 634-56   LABORATORY DATA:  Lab Results  Component Value Date   WBC 9.9 08/17/2011   HGB 12.5* 08/17/2011   HCT 36.7* 08/17/2011   MCV 86.8 08/17/2011   PLT 175 08/17/2011   Lab Results  Component Value Date   NA 141 08/15/2011   K 3.9 08/15/2011   CL 107 08/15/2011   CO2 22 08/15/2011    Lab Results  Component Value Date   ALT 51 08/15/2011   AST 41* 08/15/2011   ALKPHOS 52 08/15/2011   BILITOT 1.4* 08/15/2011     RADIOGRAPHY: Nm Bone Scan Whole Body  12/15/2013   CLINICAL DATA:  Prostate cancer.  EXAM: NUCLEAR MEDICINE WHOLE BODY BONE SCAN  TECHNIQUE: Whole body anterior and posterior images were obtained approximately 3 hours after intravenous injection of radiopharmaceutical.  RADIOPHARMACEUTICALS:  26.5 mCi Technetium-99 MDP  COMPARISON:  CT 12/15/2013.  FINDINGS: Bilateral renal function and excretion. Mild lumbar spine scoliosis noted. Minimal prominence of activity in thoracolumbar spine. Although subtle bony metastatic disease cannot be excluded, these changes are most likely degenerative. No other focal bony abnormality identified.  IMPRESSION: 1. Scoliosis lumbar spine. Mild prominence activity noted in the thoracolumbar spine. Although metastatic disease cannot be excluded. These changes are most likely degenerative. 2. No other focal  abnormality noted suggest metastatic disease.   Electronically Signed   By: Marcello Moores  Register   On: 12/15/2013 11:57      IMPRESSION: This gentleman is a 73 y.o. gentleman with stage T1c adenocarcinoma of the prostate with a Gleason's score of 4+4 and a PSA of 10.35.  His T-Stage, Gleason's Score, and PSA put him into the high risk group.  Accordingly he is eligible for a variety of potential treatment options including 2-3 years of androgen deprivation with radiotherapy to the pelvis versus indefinite androgen deprivation alone.  PLAN:Today I reviewed the findings and workup thus far.  We discussed the natural history of prostate cancer.  We reviewed the the implications of T-stage, Gleason's Score, and PSA on decision-making and outcomes in prostate cancer.  We discussed radiation treatment in the management of prostate cancer with regard to the logistics and delivery of external beam radiation treatment as well as the logistics and  delivery of prostate brachytherapy.  We compared and contrasted each of these approaches and also compared these against prostatectomy.  The patient expressed interest in external beam radiotherapy.  I filled out a patient counseling form for him with relevant treatment diagrams and we retained a copy for our records.   The patient would like to proceed with androgen deprivation. If he elects to proceed with pelvic radiotherapy, I will move forward with scheduling placement of three gold fiducial markers into the prostate to proceed with IMRT 2-3 months after the initiation of androgen deprivation.     I enjoyed meeting with him today, and will look forward to participating in the care of this very nice gentleman.  I spent 60 minutes face to face with the patient and more than 50% of that time was spent in counseling and/or coordination of care.   ------------------------------------------------  Sheral Apley. Tammi Klippel, M.D.

## 2014-01-03 NOTE — Progress Notes (Signed)
[place] Psychosocial Distress Screening Clinical Social Work Chaplain met patient in multidisciplinary prostate clinic and assessed distress by distress screening protocol.  The patient scored a 3 on the Psychosocial Distress Thermometer which indicates mild distress.   Clinical Social Worker follow up needed: No.  Patient's main concern was being aware of his mortality. We spent quite a while talking about this and about how to live fully each day--because we never know how long we have to live. Helped him dream a little about what he would like to do and to create a plan to keep him focused on the present. He is a man of faith. I helped him think about how his faith might impact his living. I also gave him information about the [place] and services, including the Prostate Support Group. Gave him my contact information if he has further questions.  Epifania Gore, PhD, [facility] Chaplain   [facility] DISTRESS SCREENING 01/03/2014  Screening Type Initial Screening  Distress experienced in past week (1-10) 3  Spiritual/Religous concerns type Facing my mortality  Physician notified of physical symptoms Yes  Referral to clinical psychology No  Referral to clinical social work No  Referral to dietition No  Referral to financial advocate No  Referral to support programs Yes  Referral to palliative care No

## 2014-01-03 NOTE — Progress Notes (Unsigned)
                               Care Plan Summary  Name: Dr. Jearld Pies DOB: 06-Oct-1940   Your Medical Team:   Urologist -  Dr. [name], [facility]  Radiation Oncologist - Dr.Matthew Micki [facility]   Medical Oncologist - Dr. Zola Button, [place]  Recommendations: 1) Hormone Therapy  2) Radiation    * These recommendations are based on information available as of today's consult.      Recommendations may change depending on the results of further tests or exams. Next Steps: 1) Dr. Cy Blamer office will call you for an appointment for hormone therapy and gold seeds.  2) After seed implants Dr. Johny Shears office will set up radiation appointments.  When appointments need to be scheduled, you will be contacted by [facility] and/or [facility].  Questions?  Please do not hesitate to call Cira Rue, RN, BSN, CRNI at [phone] any questions or concerns.  Shirlean Mylar is your Oncology Nurse Navigator and is available to assist you while you're receiving your medical care at [facility].

## 2014-01-09 ENCOUNTER — Encounter: Payer: Self-pay | Admitting: Medical Oncology

## 2014-01-09 ENCOUNTER — Telehealth: Payer: Self-pay | Admitting: Oncology

## 2014-01-09 ENCOUNTER — Other Ambulatory Visit: Payer: Self-pay | Admitting: Medical Oncology

## 2014-01-09 NOTE — [facility] Oncology Navigator Note (Signed)
Patient seen in Prostate [facility] clinic 01/03/14. During our post clinic visit he voiced concern about weight gain with hormone therapy. He states he already needs to loose a few pounds. I informed him I can set him up an appointment with Dory Peru for a dietary consult. He would like an appointment. He and his wife also asked about updating their advanced directives. I gave them one of our packets and I explained that can meet with [name] the social worker the same day they come for the dietary consult. They voiced understanding and were pleased. I reviewed my office number and stressed to them to call me with any questions or concerns. I spoke with Dory Peru and she is aware of the referral and POF made.

## 2014-01-19 ENCOUNTER — Encounter: Payer: Self-pay | Admitting: *Deleted

## 2014-01-19 ENCOUNTER — Ambulatory Visit: Payer: Medicare FFS | Admitting: Nutrition

## 2014-01-19 NOTE — Progress Notes (Signed)
74 year old male diagnosed with prostate cancer.  He is a patient of Dr. Alen Blew.  Past medical history includes hypertension, GERD, hyperlipidemia, hiatal hernia, diabetes, diverticulosis, and dysphasia.  Medications include Lipitor, vitamin D3, multivitamin, omega-3 fatty acids, Prilosec, selenium, zinc.  Labs were reviewed.  Height: 5 feet 11 inches. Weight: 249.7 pounds. Usual body weight: 235 pounds. BMI: 34.84.  Patient reports he is starting androgen deprivation therapy.  He is concerned with weight gain during treatment.  Reports he was diagnosed as a borderline diabetic and completed diabetic education.  However, he does not really follow a diabetic diet nor does he check blood sugars.  Patient reports blood sugars have been within a normal range at M.D. office.  Currently patient is not exercising however he has a bicycle and he enjoys walking.  Nutrition diagnosis: Food and nutrition related knowledge deficit related to new diagnosis of prostate cancer and associated treatments as evidenced by no prior need for nutrition related information.  Intervention: Patient educated to increase plant-based foods and strive for 5-7 servings of vegetables and whole fruits daily. Recommended patient increase plant-based proteins. Recommended including fatty fish twice a week. Encouraged foods with lycopene. Recommended low-fat diet. Reviewed exercise recommendations with patient. Provided fact sheets.  Questions were answered.  Teach back method used.  Contact information given.  Monitoring, evaluation, goals: Patient will follow a plant-based diet with controlled calories to promote safe weight loss.  He will begin exercise regimen of his choice and increase total exercise to 30 minutes 5 days weekly.  Nutrition diagnosis resolved.  No follow-up required.  **Disclaimer: This note was dictated with voice recognition software. Similar sounding words can inadvertently be transcribed and this  note may contain transcription errors which may not have been corrected upon publication of note.**

## 2014-01-19 NOTE — Progress Notes (Signed)
[place] Social Work  Clinical Social Work was referred by prostate navigator to review and complete healthcare advance directives.  Clinical Social Worker met with patient and patient's spouse in [place] office.  The patient designated spouse Victor Goodman as their primary healthcare agent and Victor Goodman as their secondary agent.  Patient also completed healthcare living will.    Clinical Social Worker notarized documents and made copies for patient/family. Clinical Social Worker will send documents to medical records to be scanned into patient's chart. Clinical Social Worker encouraged patient/family to contact with any additional questions or concerns.  [name], MSW, [place] Worker [facility] [phone]

## 2014-02-08 ENCOUNTER — Encounter: Payer: Self-pay | Admitting: Medical Oncology

## 2014-02-08 NOTE — [facility] Oncology Navigator Note (Signed)
I called Mr. Wimbush to see how he is doing with his hormone therapy and the possible start date of radiation. He states that he will get his 2nd hormone shot tomorrow. He states so far so good. He has had some hot flashes but his energy remains good. His understanding is after he gets the third injection he will get the gold makers placed and start the radiation. I will continue to follow his progress and asked him to call me with any questions or concerns. He voiced understanding.    Cira Rue, RN, BSN, [place]  [phone] Fax [phone]

## 2014-03-14 ENCOUNTER — Telehealth: Payer: Self-pay | Admitting: Medical Oncology

## 2014-03-14 ENCOUNTER — Telehealth: Payer: Self-pay | Admitting: *Deleted

## 2014-03-14 NOTE — Telephone Encounter (Signed)
I followed up with Victor Goodman to see how he is doing and if he is ready to proceed with radiation. He stated he has received his third ADT injection. I asked if he would like to come back in and meet with Dr. Tammi Klippel to further discuss or if he would like to proceed. He voiced he was ready to get started. We discussed he will need to get gold markers placed and then simulation. I discussed what takes place during simulation. He voiced understanding. I asked him to call me if he has questions or concerns. This information was forwarded to Dr. Tammi Klippel.

## 2014-03-14 NOTE — Telephone Encounter (Signed)
Called patient to inform of gold seed placement on 04-27-14 @ 9:30 am @ Dr. Cy Blamer Office and his sim on 05-05-14 @ 11 am @ Dr. Johny Shears Office, spoke with patient and he is aware of these appts.

## 2014-04-04 ENCOUNTER — Telehealth: Payer: Self-pay | Admitting: *Deleted

## 2014-04-04 NOTE — Telephone Encounter (Signed)
Called patient to inform of sim appt. Being moved to 05-05-14 @ 2 pm, due to Dr. Tammi Klippel being in the OR, lvm for a return call

## 2014-04-17 ENCOUNTER — Telehealth: Payer: Self-pay | Admitting: Medical Oncology

## 2014-04-17 NOTE — Telephone Encounter (Signed)
Julie-wife called stating she is concerned that it is taking so long to start radiation. I explained that he had to receive the  androgen deprivation therapy for 3 months before he starts the radiation. Pt is aware that he will be simulated 4/22. I explained what takes place when he gets simulated and then his treatments will start 7-10 days after. She also states he saw his primary care Dr. Laurann Montana and she has not received any notes. I will fax records to Dr. Laurann Montana. She also had some questions about other problems  that are listed on My Chart. We discussed these issues which are related to his diabetes and  labs. She states she was concerned about the delay but now understands the process much better. I asked her to call me any time with questions and concerns. I plan to see him the day of simulation.    Cira Rue, RN, BSN, [place]  [phone] Fax [phone]

## 2014-05-05 ENCOUNTER — Ambulatory Visit
Admission: RE | Admit: 2014-05-05 | Discharge: 2014-05-05 | Disposition: A | Discharge status: Home / Self Care | Payer: Medicare PPO | Source: Ambulatory Visit | Attending: Radiation Oncology | Admitting: Radiation Oncology

## 2014-05-05 DIAGNOSIS — C61 Malignant neoplasm of prostate: Secondary | ICD-10-CM | POA: Diagnosis not present

## 2014-05-05 NOTE — Progress Notes (Signed)
  Radiation Oncology         [phone]) [phone] ________________________________  Name: Victor Goodman MRN: [id number]  Date: 05/05/2014  DOB: 23-May-1940  SIMULATION AND TREATMENT PLANNING NOTE    ICD-9-CM ICD-10-CM   1. Malignant neoplasm of prostate 185 C61     DIAGNOSIS:  74 y.o. gentleman with stage T1c adenocarcinoma of the prostate with a Gleason's score of 4+4 and a PSA of 10.35  NARRATIVE:  The patient was brought to the [facility].  Identity was confirmed.  All relevant records and images related to the planned course of therapy were reviewed.  The patient freely provided informed written consent to proceed with treatment after reviewing the details related to the planned course of therapy. The consent form was witnessed and verified by the simulation staff.  Then, the patient was set-up in a stable reproducible supine position for radiation therapy.  A vacuum lock pillow device was custom fabricated to position his legs in a reproducible immobilized position.  Then, I performed a urethrogram under sterile conditions to identify the prostatic apex.  CT images were obtained.  Surface markings were placed.  The CT images were loaded into the planning software.  Then the prostate target and avoidance structures including the rectum, bladder, bowel and hips were contoured.  Treatment planning then occurred.  The radiation prescription was entered and confirmed.  A total of 1 complex treatment devices were fabricated. I have requested : Intensity Modulated Radiotherapy (IMRT) is medically necessary for this case for the following reason:  Rectal sparing.Marland Kitchen  PLAN:  The patient will receive 75 Gy in 40 fractions.  This document serves as a record of services personally performed by Tyler Pita, MD. It was created on his behalf by Arlyce Harman, a trained medical scribe. The creation of this record is based on the scribe's personal observations and the provider's statements to  them. This document has been checked and approved by the attending provider.     ________________________________  Sheral Apley. Tammi Klippel, M.D.

## 2014-05-15 DIAGNOSIS — C61 Malignant neoplasm of prostate: Secondary | ICD-10-CM | POA: Diagnosis not present

## 2014-05-16 ENCOUNTER — Ambulatory Visit
Admission: RE | Admit: 2014-05-16 | Discharge: 2014-05-16 | Disposition: A | Discharge status: Home / Self Care | Payer: Medicare PPO | Source: Ambulatory Visit | Attending: Radiation Oncology | Admitting: Radiation Oncology

## 2014-05-16 ENCOUNTER — Encounter: Payer: Self-pay | Admitting: Medical Oncology

## 2014-05-16 DIAGNOSIS — C61 Malignant neoplasm of prostate: Secondary | ICD-10-CM | POA: Diagnosis not present

## 2014-05-16 NOTE — [facility] Oncology Navigator Note (Signed)
I met with  Mr. Victor Goodman today after his first radiation treatment. He stated got the first one down. He states the simulation went well, his only complaint is "hot flashes" in the middle of the night. He receives his hormone injection every 4 months. So far, he has done well. I asked him to call me with any concerns or questions. He voiced understanding.    Cira Rue, RN, BSN, [place]  [phone]  Fax [phone]

## 2014-05-17 ENCOUNTER — Ambulatory Visit
Admission: RE | Admit: 2014-05-17 | Discharge: 2014-05-17 | Disposition: A | Discharge status: Home / Self Care | Payer: Medicare PPO | Source: Ambulatory Visit | Attending: Radiation Oncology | Admitting: Radiation Oncology

## 2014-05-17 DIAGNOSIS — C61 Malignant neoplasm of prostate: Secondary | ICD-10-CM | POA: Diagnosis not present

## 2014-05-18 ENCOUNTER — Ambulatory Visit
Admission: RE | Admit: 2014-05-18 | Discharge: 2014-05-18 | Disposition: A | Discharge status: Home / Self Care | Payer: Medicare PPO | Source: Ambulatory Visit | Attending: Radiation Oncology | Admitting: Radiation Oncology

## 2014-05-18 DIAGNOSIS — C61 Malignant neoplasm of prostate: Secondary | ICD-10-CM | POA: Diagnosis not present

## 2014-05-19 ENCOUNTER — Ambulatory Visit
Admission: RE | Admit: 2014-05-19 | Discharge: 2014-05-19 | Disposition: A | Discharge status: Home / Self Care | Payer: Medicare PPO | Source: Ambulatory Visit | Attending: Radiation Oncology | Admitting: Radiation Oncology

## 2014-05-19 VITALS — BP 150/77 | HR 85 | Resp 16 | Wt 250.0 lb

## 2014-05-19 DIAGNOSIS — C61 Malignant neoplasm of prostate: Secondary | ICD-10-CM

## 2014-05-19 NOTE — Addendum Note (Signed)
Encounter addended by: Heywood Footman, RN on: 05/19/2014 10:05 AM<BR>     Documentation filed: Inpatient Patient Education, Notes Section, Chief Complaint Section

## 2014-05-19 NOTE — Progress Notes (Signed)
  Radiation Oncology         [phone]) [phone] ________________________________  Name: Victor Goodman MRN: [id number]  Date: 05/19/2014  DOB: [birth year]  Weekly Radiation Therapy Management    ICD-9-CM ICD-10-CM   1. Malignant neoplasm of prostate 185 C61     Current Dose: 7.2 Gy     Planned Dose:  75 Gy  Narrative . . . . . . . . The patient presents for routine under treatment assessment. Weight and vitals stable. Denies pain. Denies fatigue. Reports nocturia x3. Denies dysuria, hematuria or diarrhea Set-up films were reviewed. The chart was checked.  Physical Findings. . .  weight is 250 lb (113.399 kg). His blood pressure is 150/77 and his pulse is 85. His respiration is 16 and oxygen saturation is 98%. . Weight essentially stable.  No significant changes.  Impression . . . . . . . The patient is tolerating radiation.  Plan . . . . . . . . . . . . Continue treatment as planned. [place] Friday  This document serves as a record of services personally performed by Tyler Pita, MD. It was created on his behalf by Jeralene Peters, a trained medical scribe. The creation of this record is based on the scribe's personal observations and the provider's statements to them. This document has been checked and approved by the attending provider.     ________________________________  Sheral Apley. Tammi Klippel, M.D.

## 2014-05-19 NOTE — Progress Notes (Signed)
Weight and vitals stable. Denies pain. Denies fatigue. Reports nocturia x3. Denies dysuria, hematuria or diarrhea.

## 2014-05-19 NOTE — Progress Notes (Signed)
Post sim education completed. Oriented patient to staff and routine of the clinic. Provided patient with RADIATION THERAPY AND YOU handbook then, reviewed pertinent information. Educated patient reference potential side effects and management such as fatigue, diarrhea and urinary/bladder changes. Patient understands to call [phone] with needs. Patient verbalized understanding of all reviewed.

## 2014-05-22 ENCOUNTER — Encounter: Payer: Self-pay | Admitting: Medical Oncology

## 2014-05-22 ENCOUNTER — Ambulatory Visit
Admission: RE | Admit: 2014-05-22 | Discharge: 2014-05-22 | Disposition: A | Discharge status: Home / Self Care | Payer: Medicare PPO | Source: Ambulatory Visit | Attending: Radiation Oncology | Admitting: Radiation Oncology

## 2014-05-22 DIAGNOSIS — C61 Malignant neoplasm of prostate: Secondary | ICD-10-CM | POA: Diagnosis not present

## 2014-05-22 NOTE — [facility] Oncology Navigator Note (Signed)
I spoke with Victor Goodman post radiation this morning. He states everything is going well and he has completed his first week. I asked him to call me with any questions or concerns. He voiced understanding.   Cira Rue, RN, BSN, [place]  [phone] Fax [phone]

## 2014-05-23 ENCOUNTER — Ambulatory Visit
Admission: RE | Admit: 2014-05-23 | Discharge: 2014-05-23 | Disposition: A | Discharge status: Home / Self Care | Payer: Medicare PPO | Source: Ambulatory Visit | Attending: Radiation Oncology | Admitting: Radiation Oncology

## 2014-05-23 DIAGNOSIS — C61 Malignant neoplasm of prostate: Secondary | ICD-10-CM | POA: Diagnosis not present

## 2014-05-24 ENCOUNTER — Ambulatory Visit
Admission: RE | Admit: 2014-05-24 | Discharge: 2014-05-24 | Disposition: A | Discharge status: Home / Self Care | Payer: Medicare PPO | Source: Ambulatory Visit | Attending: Radiation Oncology | Admitting: Radiation Oncology

## 2014-05-24 DIAGNOSIS — C61 Malignant neoplasm of prostate: Secondary | ICD-10-CM | POA: Diagnosis not present

## 2014-05-25 ENCOUNTER — Ambulatory Visit
Admission: RE | Admit: 2014-05-25 | Discharge: 2014-05-25 | Disposition: A | Discharge status: Home / Self Care | Payer: Medicare PPO | Source: Ambulatory Visit | Attending: Radiation Oncology | Admitting: Radiation Oncology

## 2014-05-25 DIAGNOSIS — C61 Malignant neoplasm of prostate: Secondary | ICD-10-CM | POA: Diagnosis not present

## 2014-05-26 ENCOUNTER — Ambulatory Visit
Admission: RE | Admit: 2014-05-26 | Discharge: 2014-05-26 | Disposition: A | Discharge status: Home / Self Care | Payer: Medicare PPO | Source: Ambulatory Visit | Attending: Radiation Oncology | Admitting: Radiation Oncology

## 2014-05-26 ENCOUNTER — Encounter: Payer: Self-pay | Admitting: Radiation Oncology

## 2014-05-26 VITALS — BP 166/82 | HR 72 | Resp 16 | Wt 247.3 lb

## 2014-05-26 DIAGNOSIS — C61 Malignant neoplasm of prostate: Secondary | ICD-10-CM | POA: Diagnosis not present

## 2014-05-26 NOTE — Progress Notes (Signed)
   Department of Radiation Oncology  Phone:  [phone] Fax:        [phone]  Weekly Treatment Note    Name: Victor Goodman Date: 05/26/2014 MRN: [id number] DOB: 1940-09-13   Current dose: 16.2 Gy  Current fraction: 9   MEDICATIONS: Current Outpatient Prescriptions  Medication Sig Dispense Refill  . ACCU-CHEK FASTCLIX LANCETS MISC     . ACCU-CHEK SMARTVIEW test strip     . aspirin 325 MG tablet Take 325 mg by mouth daily.    Marland Kitchen atorvastatin (LIPITOR) 20 MG tablet Take 20 mg by mouth daily.    . Blood Glucose Calibration (ACCU-CHEK SMARTVIEW CONTROL) LIQD     . Blood Glucose Monitoring Suppl (ACCU-CHEK NANO SMARTVIEW) W/DEVICE KIT     . Cholecalciferol (VITAMIN D3) 2000 UNITS TABS Take by mouth.    Marland Kitchen ibuprofen (ADVIL,MOTRIN) 200 MG tablet Take 400 mg by mouth every 6 (six) hours as needed.    . Lancets Misc. (ACCU-CHEK FASTCLIX LANCET) KIT     . levofloxacin (LEVAQUIN) 500 MG tablet     . losartan-hydrochlorothiazide (HYZAAR) 100-12.5 MG per tablet Take 1 tablet by mouth daily.    . Multiple Vitamin (MULTIVITAMIN WITH MINERALS) TABS Take 1 tablet by mouth daily.    . Omega-3 Fatty Acids (FISH OIL) 1000 MG CPDR Take by mouth.    Marland Kitchen omeprazole (PRILOSEC) 40 MG capsule Take 40 mg by mouth daily.    . Selenium 200 MCG CAPS Take by mouth.    . zinc gluconate 50 MG tablet Take 50 mg by mouth daily.     No current facility-administered medications for this encounter.     ALLERGIES: Review of patient's allergies indicates no known allergies.   LABORATORY DATA:  Lab Results  Component Value Date   WBC 9.9 08/17/2011   HGB 12.5* 08/17/2011   HCT 36.7* 08/17/2011   MCV 86.8 08/17/2011   PLT 175 08/17/2011   Lab Results  Component Value Date   NA 141 08/15/2011   K 3.9 08/15/2011   CL 107 08/15/2011   CO2 22 08/15/2011   Lab Results  Component Value Date   ALT 51 08/15/2011   AST 41* 08/15/2011   ALKPHOS 52 08/15/2011   BILITOT 1.4* 08/15/2011      NARRATIVE: Victor Goodman was seen today for weekly treatment management. The chart was checked and the patient's films were reviewed.  Weight stable. BP slightly elevated. Reports taking Imodium for diarrhea following stopping omeprazole. Reports nocturia x3. Denies dysuria, hematuria or fatigue. Reports hot flashes continues related to androgen deprivation.   PHYSICAL EXAMINATION: weight is 247 lb 4.8 oz (112.175 kg). His blood pressure is 166/82 and his pulse is 72. His respiration is 16.        ASSESSMENT: The patient is doing satisfactorily with treatment.  PLAN: We will continue with the patient's radiation treatment as planned.        This document serves as a record of services personally performed by Kyung Rudd, MD. It was created on his behalf by Derek Mound, a trained medical scribe. The creation of this record is based on the scribe's personal observations and the provider's statements to them. This document has been checked and approved by the attending provider.

## 2014-05-26 NOTE — Progress Notes (Signed)
Weight stable. BP slightly elevated. Reports taking Imodium for diarrhea following stopping omeprazole. Reports nocturia x3. Denies dysuria, hematuria or fatigue. Reports hot flashes continues related to androgen deprivation.

## 2014-05-29 ENCOUNTER — Ambulatory Visit
Admission: RE | Admit: 2014-05-29 | Discharge: 2014-05-29 | Disposition: A | Discharge status: Home / Self Care | Payer: Medicare PPO | Source: Ambulatory Visit | Attending: Radiation Oncology | Admitting: Radiation Oncology

## 2014-05-29 DIAGNOSIS — C61 Malignant neoplasm of prostate: Secondary | ICD-10-CM | POA: Diagnosis not present

## 2014-05-30 ENCOUNTER — Ambulatory Visit
Admission: RE | Admit: 2014-05-30 | Discharge: 2014-05-30 | Disposition: A | Discharge status: Home / Self Care | Payer: Medicare PPO | Source: Ambulatory Visit | Attending: Radiation Oncology | Admitting: Radiation Oncology

## 2014-05-30 DIAGNOSIS — C61 Malignant neoplasm of prostate: Secondary | ICD-10-CM | POA: Diagnosis not present

## 2014-05-31 ENCOUNTER — Encounter: Payer: Self-pay | Admitting: Medical Oncology

## 2014-05-31 ENCOUNTER — Ambulatory Visit
Admission: RE | Admit: 2014-05-31 | Discharge: 2014-05-31 | Disposition: A | Discharge status: Home / Self Care | Payer: Medicare PPO | Source: Ambulatory Visit | Attending: Radiation Oncology | Admitting: Radiation Oncology

## 2014-05-31 DIAGNOSIS — C61 Malignant neoplasm of prostate: Secondary | ICD-10-CM | POA: Diagnosis not present

## 2014-05-31 NOTE — Progress Notes (Signed)
Oncology Nurse Navigator Documentation  Oncology Nurse Navigator Flowsheets 05/31/2014  Navigator Encounter Type Treatment- Mr. Sotomayor on week 3 of radiation treatments. He states he is doing well but had a little problem last week but not related to his radiation. He has been taking omeprazole prescribed by Dr. Cristina Gong and decided to wean himself off. He had heard the advertisements and read articles about taking omeprazole for long term causes kidney damage. He started feeling terrible and felt he was getting dehydrated. He restarted the medication and now back to feeling normal. We discussed he should get an appointment with Dr. Cristina Gong to discuss. He states that he has an appointment next week.I asked him to call me with any questions or concerns. He voiced understanding.  Treatment Phase Treatment  Barriers/Navigation Needs No barriers at this time  Interventions None required  Time Spent with Patient 15

## 2014-06-01 ENCOUNTER — Ambulatory Visit
Admission: RE | Admit: 2014-06-01 | Discharge: 2014-06-01 | Disposition: A | Discharge status: Home / Self Care | Payer: Medicare PPO | Source: Ambulatory Visit | Attending: Radiation Oncology | Admitting: Radiation Oncology

## 2014-06-01 ENCOUNTER — Encounter: Payer: Self-pay | Admitting: Radiation Oncology

## 2014-06-01 VITALS — BP 159/76 | HR 75 | Resp 16 | Wt 248.2 lb

## 2014-06-01 DIAGNOSIS — C61 Malignant neoplasm of prostate: Secondary | ICD-10-CM | POA: Diagnosis not present

## 2014-06-01 NOTE — Progress Notes (Signed)
Weight stable. BP slightly elevated today but, patient taking HCTZ following xrt to ensure his bladder is full for treatment. Reports diarrhea has resolved. Reports nocturia x3. Denies dysuria, hematuria or fatigue. Hot flashes continue.

## 2014-06-01 NOTE — Progress Notes (Signed)
  Radiation Oncology         [id number]) [phone] ________________________________  Name: Victor Goodman MRN: [id number]  Date: 06/01/2014  DOB: February 16, 1940    Weekly Radiation Therapy Management    ICD-9-CM ICD-10-CM   1. Malignant neoplasm of prostate 185 C61     Current Dose: 23.4 Gy     Planned Dose:  75 Gy  Narrative . . . . . . . . The patient presents for routine under treatment assessment.                                   Weight stable. BP slightly elevated today but, patient taking HCTZ following xrt to ensure his bladder is full for treatment. Reports diarrhea has resolved. Reports nocturia x3. Denies dysuria, hematuria or fatigue. Hot flashes continue. He has experienced dehydration after discontinuing his Metrozole??. Has been keeping hydrated since. He has been taking ibuprofen to help with back pain and was concerned of its effect with radiation.                                  Set-up films were reviewed.                                 The chart was checked. Physical Findings. . .  weight is 248 lb 3.2 oz (112.583 kg). His blood pressure is 159/76 and his pulse is 75. His respiration is 16. . Weight essentially stable.  No significant changes. Impression . . . . . . . The patient is tolerating radiation. Plan . . . . . . . . . . . . Continue treatment as planned.  This document serves as a record of services personally performed by Tyler Pita, MD. It was created on his behalf by Arlyce Harman, a trained medical scribe. The creation of this record is based on the scribe's personal observations and the provider's statements to them. This document has been checked and approved by the attending provider.     ________________________________  Sheral Apley. Tammi Klippel, M.D.

## 2014-06-02 ENCOUNTER — Ambulatory Visit
Admission: RE | Admit: 2014-06-02 | Discharge: 2014-06-02 | Disposition: A | Discharge status: Home / Self Care | Payer: Medicare PPO | Source: Ambulatory Visit | Attending: Radiation Oncology | Admitting: Radiation Oncology

## 2014-06-02 DIAGNOSIS — C61 Malignant neoplasm of prostate: Secondary | ICD-10-CM | POA: Diagnosis not present

## 2014-06-05 ENCOUNTER — Ambulatory Visit
Admission: RE | Admit: 2014-06-05 | Discharge: 2014-06-05 | Disposition: A | Discharge status: Home / Self Care | Payer: Medicare PPO | Source: Ambulatory Visit | Attending: Radiation Oncology | Admitting: Radiation Oncology

## 2014-06-05 DIAGNOSIS — C61 Malignant neoplasm of prostate: Secondary | ICD-10-CM | POA: Diagnosis not present

## 2014-06-06 ENCOUNTER — Ambulatory Visit
Admission: RE | Admit: 2014-06-06 | Discharge: 2014-06-06 | Disposition: A | Discharge status: Home / Self Care | Payer: Medicare PPO | Source: Ambulatory Visit | Attending: Radiation Oncology | Admitting: Radiation Oncology

## 2014-06-06 DIAGNOSIS — C61 Malignant neoplasm of prostate: Secondary | ICD-10-CM | POA: Diagnosis not present

## 2014-06-07 ENCOUNTER — Ambulatory Visit
Admission: RE | Admit: 2014-06-07 | Discharge: 2014-06-07 | Disposition: A | Discharge status: Home / Self Care | Payer: Medicare PPO | Source: Ambulatory Visit | Attending: Radiation Oncology | Admitting: Radiation Oncology

## 2014-06-07 DIAGNOSIS — C61 Malignant neoplasm of prostate: Secondary | ICD-10-CM | POA: Diagnosis not present

## 2014-06-08 ENCOUNTER — Ambulatory Visit
Admission: RE | Admit: 2014-06-08 | Discharge: 2014-06-08 | Disposition: A | Discharge status: Home / Self Care | Payer: Medicare PPO | Source: Ambulatory Visit | Attending: Radiation Oncology | Admitting: Radiation Oncology

## 2014-06-08 DIAGNOSIS — C61 Malignant neoplasm of prostate: Secondary | ICD-10-CM | POA: Diagnosis not present

## 2014-06-09 ENCOUNTER — Encounter: Payer: Self-pay | Admitting: Medical Oncology

## 2014-06-09 ENCOUNTER — Encounter: Payer: Self-pay | Admitting: Radiation Oncology

## 2014-06-09 ENCOUNTER — Ambulatory Visit
Admission: RE | Admit: 2014-06-09 | Discharge: 2014-06-09 | Disposition: A | Discharge status: Home / Self Care | Payer: Medicare PPO | Source: Ambulatory Visit | Attending: Radiation Oncology | Admitting: Radiation Oncology

## 2014-06-09 VITALS — BP 123/75 | HR 78 | Resp 16 | Wt 244.4 lb

## 2014-06-09 DIAGNOSIS — C61 Malignant neoplasm of prostate: Secondary | ICD-10-CM | POA: Diagnosis not present

## 2014-06-09 MED ORDER — TAMSULOSIN HCL 0.4 MG PO CAPS: 0.4000 mg | ORAL_CAPSULE | Freq: Every day | ORAL | Status: DC

## 2014-06-09 NOTE — Progress Notes (Signed)
Oncology Nurse Navigator Documentation  Oncology Nurse Navigator Flowsheets 05/31/2014 06/09/2014  Navigator Encounter Type Treatment Treatment  Patient Visit Type - Radonc  Treatment Phase Treatment Treatment-today is treatment #19. He is doing well but has had some fatigue and nocturia. He will discuss with Dr. Tammi Klippel.   Barriers/Navigation Needs No barriers at this time No barriers at this time  Interventions None required -  Time Spent with Patient 15 15

## 2014-06-09 NOTE — Progress Notes (Signed)
Weight and vitals stable. Denies pain. Reports mild intermittent irritation with urination but, denies dysuria. Denies hematuria. Reports nocturia x5. Reports urinary frequency has increased. Reports mild fatigue. Reports he alternates between diarrhea and constipation.   BP 123/75 mmHg  Pulse 78  Resp 16  Wt 244 lb 6.4 oz (110.859 kg) Wt Readings from Last 3 Encounters:  05/19/14 250 lb (113.399 kg)  03/21/13 235 lb (106.595 kg)  08/28/11 243 lb 8 oz (110.451 kg)

## 2014-06-09 NOTE — Progress Notes (Signed)
  Radiation Oncology         [phone]) [phone] ________________________________  Name: Victor Goodman MRN: [id number]  Date: 06/09/2014  DOB: [birth year]    Weekly Radiation Therapy Management    ICD-9-CM ICD-10-CM   1. Malignant neoplasm of prostate 185 C61     Current Dose: 34.2 Gy     Planned Dose:  75 Gy  Narrative: Weekly assessment.   Weight and vitals stable. Denies pain. Reports mild intermittent irritation with urination but, denies dysuria. Denies hematuria. Reports nocturia x5. Reports urinary frequency has increased. Reports mild fatigue. Reports he alternates between diarrhea and constipation.   BP 123/75 mmHg  Pulse 78  Resp 16  Wt 244 lb 6.4 oz (110.859 kg) Wt Readings from Last 3 Encounters:  05/19/14 250 lb (113.399 kg)  03/21/13 235 lb (106.595 kg)  08/28/11 243 lb 8 oz (110.451 kg)                                                                      Set-up films were reviewed. The chart was checked.  Physical Findings:  weight is 244 lb 6.4 oz (110.859 kg). His blood pressure is 123/75 and his pulse is 78. His respiration is 16. . Weight essentially stable.  No significant changes.  Impression: The patient is tolerating radiation.  Plan: Continue treatment as planned.  Will try Flomax   This document serves as a record of services personally performed by Tyler Pita, MD. It was created on his behalf by Lenn Cal, a trained medical scribe. The creation of this record is based on the scribe's personal observations and the provider's statements to them. This document has been checked and approved by the attending provider.      ________________________________  Sheral Apley. Tammi Klippel, M.D.

## 2014-06-13 ENCOUNTER — Ambulatory Visit
Admission: RE | Admit: 2014-06-13 | Discharge: 2014-06-13 | Disposition: A | Discharge status: Home / Self Care | Payer: Medicare PPO | Source: Ambulatory Visit | Attending: Radiation Oncology | Admitting: Radiation Oncology

## 2014-06-13 DIAGNOSIS — C61 Malignant neoplasm of prostate: Secondary | ICD-10-CM | POA: Diagnosis not present

## 2014-06-14 ENCOUNTER — Ambulatory Visit
Admission: RE | Admit: 2014-06-14 | Discharge: 2014-06-14 | Disposition: A | Discharge status: Home / Self Care | Payer: Medicare PPO | Source: Ambulatory Visit | Attending: Radiation Oncology | Admitting: Radiation Oncology

## 2014-06-14 DIAGNOSIS — C61 Malignant neoplasm of prostate: Secondary | ICD-10-CM | POA: Diagnosis not present

## 2014-06-15 ENCOUNTER — Encounter: Payer: Self-pay | Admitting: Medical Oncology

## 2014-06-15 ENCOUNTER — Ambulatory Visit
Admission: RE | Admit: 2014-06-15 | Discharge: 2014-06-15 | Disposition: A | Discharge status: Home / Self Care | Payer: Medicare PPO | Source: Ambulatory Visit | Attending: Radiation Oncology | Admitting: Radiation Oncology

## 2014-06-15 DIAGNOSIS — C61 Malignant neoplasm of prostate: Secondary | ICD-10-CM | POA: Diagnosis not present

## 2014-06-15 NOTE — Progress Notes (Signed)
Oncology Nurse Navigator Documentation  Oncology Nurse Navigator Flowsheets 05/31/2014 06/09/2014 06/15/2014  Navigator Encounter Type Treatment Treatment Treatment  Patient Visit Type - Radonc Radonc  Treatment Phase Treatment Treatment Treatment- Pt states he is doing well with radiation. He has had some issues with dizziness but states he does not drink enough water. When he hydrates himself he does not have these.He is making a conscious effort to drink more water.   Barriers/Navigation Needs No barriers at this time No barriers at this time No barriers at this time  Interventions None required - -  Time Spent with Patient 15 15 15

## 2014-06-16 ENCOUNTER — Ambulatory Visit
Admission: RE | Admit: 2014-06-16 | Discharge: 2014-06-16 | Disposition: A | Discharge status: Home / Self Care | Payer: Medicare PPO | Source: Ambulatory Visit | Attending: Radiation Oncology | Admitting: Radiation Oncology

## 2014-06-16 ENCOUNTER — Encounter: Payer: Self-pay | Admitting: Radiation Oncology

## 2014-06-16 VITALS — BP 139/92 | HR 83 | Resp 16 | Wt 246.9 lb

## 2014-06-16 DIAGNOSIS — C61 Malignant neoplasm of prostate: Secondary | ICD-10-CM

## 2014-06-16 NOTE — Progress Notes (Addendum)
Weight and vitals stable. Denies pain. Reports nocturia x 3-4. Reports Flomax seems to help. Denies hematuria or dysuria. Reports intermittent manageable diarrhea with Imodium. Reports mild fatigue.   Wt Readings from Last 3 Encounters:  05/19/14 250 lb (113.399 kg)  03/21/13 235 lb (106.595 kg)  08/28/11 243 lb 8 oz (110.451 kg)   BP 139/92 mmHg  Pulse 83  Resp 16  Wt 246 lb 14.4 oz (111.993 kg)

## 2014-06-16 NOTE — Progress Notes (Addendum)
  Radiation Oncology         [phone]) [phone] ________________________________  Name: Victor Goodman MRN: [id number]  Date: 06/16/2014  DOB: Nov 30, 1940    Weekly Radiation Therapy Management    ICD-9-CM ICD-10-CM   1. Malignant neoplasm of prostate 185 C61     Current Dose: 41.4 Gy     Planned Dose:  75 Gy  Narrative: Weekly assessment. Weight and vitals stable. Denies pain. Reports nocturia x 3-4. Reports Flomax seems to help. Denies hematuria or dysuria. Reports intermittent manageable diarrhea with Imodium. Reports mild fatigue and a rash in the crease of his pelvic area. Putting hydrocortisone cream on it and it helps. During the weekend with no tx it went away and when his treatment resumed it came back. States the flomax is working.  BP 139/92 mmHg  Pulse 83  Resp 16  Wt 246 lb 14.4 oz (111.993 kg) Wt Readings from Last 3 Encounters:  05/19/14 250 lb (113.399 kg)  03/21/13 235 lb (106.595 kg)  08/28/11 243 lb 8 oz (110.451 kg)   Set-up films were reviewed. The chart was checked.  Physical Findings:  weight is 246 lb 14.4 oz (111.993 kg). His blood pressure is 139/92 and his pulse is 83. His respiration is 16. . Weight essentially stable.  No significant changes. Around the waist line anteriorly, the pt has a linear erythematous distribution of shiny plaques suggesting cutaneous candidiasis.  Impression: The patient is tolerating radiation.  Plan: Continue treatment as planned. Will give him nystatin powder for the rash.  This document serves as a record of services personally performed by Tyler Pita, MD. It was created on his behalf by Darcus Austin, a trained medical scribe. The creation of this record is based on the scribe's personal observations and the provider's statements to them. This document has been checked and approved by the attending provider.     ________________________________  Sheral Apley. Tammi Klippel, M.D.

## 2014-06-17 DIAGNOSIS — C61 Malignant neoplasm of prostate: Secondary | ICD-10-CM | POA: Diagnosis not present

## 2014-06-19 ENCOUNTER — Ambulatory Visit
Admission: RE | Admit: 2014-06-19 | Discharge: 2014-06-19 | Disposition: A | Discharge status: Home / Self Care | Payer: Medicare PPO | Source: Ambulatory Visit | Attending: Radiation Oncology | Admitting: Radiation Oncology

## 2014-06-19 DIAGNOSIS — C61 Malignant neoplasm of prostate: Secondary | ICD-10-CM | POA: Diagnosis not present

## 2014-06-20 ENCOUNTER — Ambulatory Visit
Admission: RE | Admit: 2014-06-20 | Discharge: 2014-06-20 | Disposition: A | Discharge status: Home / Self Care | Payer: Medicare PPO | Source: Ambulatory Visit | Attending: Radiation Oncology | Admitting: Radiation Oncology

## 2014-06-20 DIAGNOSIS — C61 Malignant neoplasm of prostate: Secondary | ICD-10-CM | POA: Diagnosis not present

## 2014-06-21 ENCOUNTER — Ambulatory Visit
Admission: RE | Admit: 2014-06-21 | Discharge: 2014-06-21 | Disposition: A | Discharge status: Home / Self Care | Payer: Medicare PPO | Source: Ambulatory Visit | Attending: Radiation Oncology | Admitting: Radiation Oncology

## 2014-06-21 DIAGNOSIS — C61 Malignant neoplasm of prostate: Secondary | ICD-10-CM | POA: Diagnosis not present

## 2014-06-22 ENCOUNTER — Ambulatory Visit
Admission: RE | Admit: 2014-06-22 | Discharge: 2014-06-22 | Disposition: A | Discharge status: Home / Self Care | Payer: Medicare PPO | Source: Ambulatory Visit | Attending: Radiation Oncology | Admitting: Radiation Oncology

## 2014-06-22 ENCOUNTER — Encounter: Payer: Self-pay | Admitting: Radiation Oncology

## 2014-06-22 VITALS — BP 121/76 | HR 86 | Temp 98.0°F | Resp 16 | Wt 246.2 lb

## 2014-06-22 DIAGNOSIS — C61 Malignant neoplasm of prostate: Secondary | ICD-10-CM

## 2014-06-22 NOTE — Progress Notes (Signed)
Weekly rad txs prostate, slight dysuria, nocturia x 4, still has some frequency but better stated after starting flomax, regular bowel movements,  No pain, appetite good, slight fatigue 9:27 AM BP 121/76 mmHg  Pulse 86  Temp(Src) 98 F (36.7 C) (Oral)  Resp 16  Wt 246 lb 3.2 oz (111.676 kg)  Wt Readings from Last 3 Encounters:  06/22/14 246 lb 3.2 oz (111.676 kg)  06/16/14 246 lb 14.4 oz (111.993 kg)  06/09/14 244 lb 6.4 oz (110.859 kg)

## 2014-06-22 NOTE — Progress Notes (Signed)
  Radiation Oncology         [phone]) [phone] ________________________________  Name: Victor Goodman MRN: [id number]  Date: 06/22/2014  DOB: Jul 15, 1940    Weekly Radiation Therapy Management    ICD-9-CM ICD-10-CM   1. Malignant neoplasm of prostate 185 C61     Current Dose: 49 Gy     Planned Dose:  75 Gy  Narrative: Weekly assessment. Weight and vitals stable. Weekly rad txs prostate, slight dysuria, nocturia x 4, still has some frequency but better stated after starting flomax, regular bowel movements,  No pain, appetite good, slight fatigue. The Nystatin seemed to work for his rash. Set-up films were reviewed. The chart was checked.  Physical Findings:  BP 121/76 mmHg  Pulse 86  Temp(Src) 98 F (36.7 C) (Oral)  Resp 16  Wt 246 lb 3.2 oz (111.676 kg)  Weight essentially stable.  No significant changes.   Wt Readings from Last 3 Encounters:  06/22/14 246 lb 3.2 oz (111.676 kg)  06/16/14 246 lb 14.4 oz (111.993 kg)  06/09/14 244 lb 6.4 oz (110.859 kg)   Impression: The patient is tolerating radiation.  Plan: Continue treatment as planned.   This document serves as a record of services personally performed by Tyler Pita, MD. It was created on his behalf by Jeralene Peters, a trained medical scribe. The creation of this record is based on the scribe's personal observations and the provider's statements to them. This document has been checked and approved by the attending provider.       ________________________________  Sheral Apley. Tammi Klippel, M.D.

## 2014-06-23 ENCOUNTER — Ambulatory Visit
Admission: RE | Admit: 2014-06-23 | Discharge: 2014-06-23 | Disposition: A | Discharge status: Home / Self Care | Payer: Medicare PPO | Source: Ambulatory Visit | Attending: Radiation Oncology | Admitting: Radiation Oncology

## 2014-06-23 DIAGNOSIS — C61 Malignant neoplasm of prostate: Secondary | ICD-10-CM | POA: Diagnosis not present

## 2014-06-26 ENCOUNTER — Ambulatory Visit
Admission: RE | Admit: 2014-06-26 | Discharge: 2014-06-26 | Disposition: A | Discharge status: Home / Self Care | Payer: Medicare PPO | Source: Ambulatory Visit | Attending: Radiation Oncology | Admitting: Radiation Oncology

## 2014-06-26 DIAGNOSIS — C61 Malignant neoplasm of prostate: Secondary | ICD-10-CM | POA: Diagnosis not present

## 2014-06-27 ENCOUNTER — Ambulatory Visit
Admission: RE | Admit: 2014-06-27 | Discharge: 2014-06-27 | Disposition: A | Discharge status: Home / Self Care | Payer: Medicare PPO | Source: Ambulatory Visit | Attending: Radiation Oncology | Admitting: Radiation Oncology

## 2014-06-27 DIAGNOSIS — C61 Malignant neoplasm of prostate: Secondary | ICD-10-CM | POA: Diagnosis not present

## 2014-06-28 ENCOUNTER — Ambulatory Visit
Admission: RE | Admit: 2014-06-28 | Discharge: 2014-06-28 | Disposition: A | Discharge status: Home / Self Care | Payer: Medicare PPO | Source: Ambulatory Visit | Attending: Radiation Oncology | Admitting: Radiation Oncology

## 2014-06-28 DIAGNOSIS — C61 Malignant neoplasm of prostate: Secondary | ICD-10-CM | POA: Diagnosis not present

## 2014-06-29 ENCOUNTER — Ambulatory Visit
Admission: RE | Admit: 2014-06-29 | Discharge: 2014-06-29 | Disposition: A | Discharge status: Home / Self Care | Payer: Medicare PPO | Source: Ambulatory Visit | Attending: Radiation Oncology | Admitting: Radiation Oncology

## 2014-06-29 DIAGNOSIS — C61 Malignant neoplasm of prostate: Secondary | ICD-10-CM | POA: Diagnosis not present

## 2014-06-30 ENCOUNTER — Encounter: Payer: Self-pay | Admitting: Radiation Oncology

## 2014-06-30 ENCOUNTER — Ambulatory Visit
Admission: RE | Admit: 2014-06-30 | Discharge: 2014-06-30 | Disposition: A | Discharge status: Home / Self Care | Payer: Medicare PPO | Source: Ambulatory Visit | Attending: Radiation Oncology | Admitting: Radiation Oncology

## 2014-06-30 VITALS — BP 156/92 | HR 77 | Resp 16 | Wt 249.7 lb

## 2014-06-30 DIAGNOSIS — C61 Malignant neoplasm of prostate: Secondary | ICD-10-CM | POA: Diagnosis not present

## 2014-06-30 NOTE — Progress Notes (Signed)
Weight and vitals stable. Denies pain. Reports perineal irritation resolved with antifungal powder. Reports mild dysuria. Reports less nocturia since starting boost and using flomax. Reports nocturia x 2. Reports bowel urgency has resolved. Reports manageable fatigue.   BP 156/92 mmHg  Pulse 77  Resp 16  Wt 249 lb 11.2 oz (113.263 kg) Wt Readings from Last 3 Encounters:  06/30/14 249 lb 11.2 oz (113.263 kg)  06/22/14 246 lb 3.2 oz (111.676 kg)  06/16/14 246 lb 14.4 oz (111.993 kg)

## 2014-06-30 NOTE — Progress Notes (Addendum)
  Radiation Oncology         [id number]) [phone] ________________________________  Name: Victor Goodman MRN: [id number]  Date: 06/30/2014  DOB: 1940/07/27    Weekly Radiation Therapy Management    ICD-9-CM ICD-10-CM   1. Malignant neoplasm of prostate 185 C61     Current Dose: 61 Gy     Planned Dose:  75 Gy  Narrative: The patient presents for routine under treatment assessment. Weight and vitals stable. Denies pain. Reports perineal irritation resolved with antifungal powder. Reports mild dysuria. Reports less nocturia since starting boost and using flomax. Reports nocturia x 2. Reports bowel urgency has resolved. Reports manageable fatigue, but reports doing "pretty well" this week.  BP 156/92 mmHg  Pulse 77  Resp 16  Wt 249 lb 11.2 oz (113.263 kg) Wt Readings from Last 3 Encounters:  06/30/14 249 lb 11.2 oz (113.263 kg)  06/22/14 246 lb 3.2 oz (111.676 kg)  06/16/14 246 lb 14.4 oz (111.993 kg)   Set-up films were reviewed. The chart was checked.  Physical Findings:  weight is 249 lb 11.2 oz (113.263 kg). His blood pressure is 156/92 and his pulse is 77. His respiration is 16. . Weight essentially stable.  No significant changes.  Impression: The patient is tolerating radiation.  Plan: Continue treatment as planned.  This document serves as a record of services personally performed by Tyler Pita, MD. It was created on his behalf by Darcus Austin, a trained medical scribe. The creation of this record is based on the scribe's personal observations and the provider's statements to them. This document has been checked and approved by the attending provider.     ________________________________  Sheral Apley. Tammi Klippel, M.D.

## 2014-07-03 ENCOUNTER — Ambulatory Visit
Admission: RE | Admit: 2014-07-03 | Discharge: 2014-07-03 | Disposition: A | Discharge status: Home / Self Care | Payer: Medicare PPO | Source: Ambulatory Visit | Attending: Radiation Oncology | Admitting: Radiation Oncology

## 2014-07-03 ENCOUNTER — Encounter: Payer: Self-pay | Admitting: Medical Oncology

## 2014-07-03 DIAGNOSIS — C61 Malignant neoplasm of prostate: Secondary | ICD-10-CM | POA: Diagnosis not present

## 2014-07-03 NOTE — Progress Notes (Signed)
Oncology Nurse Navigator Documentation  Oncology Nurse Navigator Flowsheets 05/31/2014 06/09/2014 06/15/2014 07/03/2014  Navigator Encounter Type Treatment Treatment Treatment Treatment  Patient Visit Type - Radonc Radonc Radonc  Treatment Phase Treatment Treatment Treatment Treatment- Pt states he only has 6 treatments left after today. He is doing well and the minor issues he had with bowels has resolved. I asked him to call me with any questions or concerns. He voiced understanding.  Barriers/Navigation Needs No barriers at this time No barriers at this time No barriers at this time No barriers at this time  Interventions None required - - -  Time Spent with Patient 15 15 15  15

## 2014-07-04 ENCOUNTER — Ambulatory Visit
Admission: RE | Admit: 2014-07-04 | Discharge: 2014-07-04 | Disposition: A | Discharge status: Home / Self Care | Payer: Medicare PPO | Source: Ambulatory Visit | Attending: Radiation Oncology | Admitting: Radiation Oncology

## 2014-07-04 DIAGNOSIS — C61 Malignant neoplasm of prostate: Secondary | ICD-10-CM | POA: Diagnosis not present

## 2014-07-05 ENCOUNTER — Ambulatory Visit
Admission: RE | Admit: 2014-07-05 | Discharge: 2014-07-05 | Disposition: A | Discharge status: Home / Self Care | Payer: Medicare PPO | Source: Ambulatory Visit | Attending: Radiation Oncology | Admitting: Radiation Oncology

## 2014-07-05 DIAGNOSIS — C61 Malignant neoplasm of prostate: Secondary | ICD-10-CM | POA: Diagnosis not present

## 2014-07-06 ENCOUNTER — Ambulatory Visit
Admission: RE | Admit: 2014-07-06 | Discharge: 2014-07-06 | Disposition: A | Discharge status: Home / Self Care | Payer: Medicare PPO | Source: Ambulatory Visit | Attending: Radiation Oncology | Admitting: Radiation Oncology

## 2014-07-06 DIAGNOSIS — C61 Malignant neoplasm of prostate: Secondary | ICD-10-CM | POA: Diagnosis not present

## 2014-07-07 ENCOUNTER — Encounter: Payer: Self-pay | Admitting: Radiation Oncology

## 2014-07-07 ENCOUNTER — Ambulatory Visit
Admission: RE | Admit: 2014-07-07 | Discharge: 2014-07-07 | Disposition: A | Discharge status: Home / Self Care | Payer: Medicare PPO | Source: Ambulatory Visit | Attending: Radiation Oncology | Admitting: Radiation Oncology

## 2014-07-07 VITALS — BP 155/79 | HR 82 | Resp 16 | Wt 249.8 lb

## 2014-07-07 DIAGNOSIS — C61 Malignant neoplasm of prostate: Secondary | ICD-10-CM

## 2014-07-07 NOTE — Progress Notes (Signed)
  Radiation Oncology         [phone]) [phone] ________________________________  Name: Victor Goodman MRN: [id number]  Date: 07/07/2014  DOB: 1940/03/29    Weekly Radiation Therapy Management    ICD-9-CM ICD-10-CM   1. Malignant neoplasm of prostate 185 C61     Current Dose: 71 Gy     Planned Dose:  75 Gy  Narrative: The patient presents for routine under treatment assessment. Weight and vitals stable. Denies pain. Reports nocturia x3. Reports mild dysuria. Denies hematuria. Denies diarrhea. Reports bowel urgency has resolved. Reports manageable fatigue. The patient has no other complaints at this time.  BP 155/79 mmHg  Pulse 82  Resp 16  Wt 249 lb 12.8 oz (113.309 kg) Wt Readings from Last 3 Encounters:  07/07/14 249 lb 12.8 oz (113.309 kg)  06/30/14 249 lb 11.2 oz (113.263 kg)  06/22/14 246 lb 3.2 oz (111.676 kg)   Set-up films were reviewed. The chart was checked.  Physical Findings:  weight is 249 lb 12.8 oz (113.309 kg). His blood pressure is 155/79 and his pulse is 82. His respiration is 16. . Weight essentially stable.  No significant changes.  Impression: The patient is tolerating radiation.  Plan: Continue treatment as planned.  This document serves as a record of services personally performed by Tyler Pita, MD. It was created on his behalf by Darcus Austin, a trained medical scribe. The creation of this record is based on the scribe's personal observations and the provider's statements to them. This document has been checked and approved by the attending provider.     ________________________________  Sheral Apley. Tammi Klippel, M.D.

## 2014-07-07 NOTE — Progress Notes (Signed)
Weight and vitals stable. Denies pain. Reports nocturia x3. Reports mild dysuria. Denies hematuria. Denies diarrhea. Reports bowel urgency has resolved. Reports manageable fatigue.  BP 155/79 mmHg  Pulse 82  Resp 16  Wt 249 lb 12.8 oz (113.309 kg) Wt Readings from Last 3 Encounters:  07/07/14 249 lb 12.8 oz (113.309 kg)  06/30/14 249 lb 11.2 oz (113.263 kg)  06/22/14 246 lb 3.2 oz (111.676 kg)

## 2014-07-10 ENCOUNTER — Encounter: Payer: Self-pay | Admitting: Medical Oncology

## 2014-07-10 ENCOUNTER — Ambulatory Visit
Admission: RE | Admit: 2014-07-10 | Discharge: 2014-07-10 | Disposition: A | Discharge status: Home / Self Care | Payer: Medicare PPO | Source: Ambulatory Visit | Attending: Radiation Oncology | Admitting: Radiation Oncology

## 2014-07-10 DIAGNOSIS — C61 Malignant neoplasm of prostate: Secondary | ICD-10-CM | POA: Diagnosis not present

## 2014-07-10 NOTE — Progress Notes (Signed)
Oncology Nurse Navigator Documentation  Oncology Nurse Navigator Flowsheets 05/31/2014 06/09/2014 06/15/2014 07/03/2014 07/10/2014  Navigator Encounter Type Treatment Treatment Treatment Treatment Treatment  Patient Visit Type - Radonc Radonc Radonc Radonc  Treatment Phase Treatment Treatment Treatment Treatment Treatment- Mr. Corbit is doing well. Tomorrow is his last treatment. He is excited to be completing his treatments. I will meet patient in the morning to celebrate the ringing of the bell.  Barriers/Navigation Needs No barriers at this time No barriers at this time No barriers at this time No barriers at this time No barriers at this time  Interventions None required - - - -  Time Spent with Patient 15 15 15 15  15

## 2014-07-11 ENCOUNTER — Encounter: Payer: Self-pay | Admitting: Medical Oncology

## 2014-07-11 ENCOUNTER — Encounter: Payer: Self-pay | Admitting: Radiation Oncology

## 2014-07-11 ENCOUNTER — Ambulatory Visit
Admission: RE | Admit: 2014-07-11 | Discharge: 2014-07-11 | Disposition: A | Discharge status: Home / Self Care | Payer: Medicare PPO | Source: Ambulatory Visit | Attending: Radiation Oncology | Admitting: Radiation Oncology

## 2014-07-11 DIAGNOSIS — C61 Malignant neoplasm of prostate: Secondary | ICD-10-CM | POA: Diagnosis not present

## 2014-07-11 NOTE — Progress Notes (Signed)
Oncology Nurse Navigator Documentation  Oncology Nurse Navigator Flowsheets 05/31/2014 06/09/2014 06/15/2014 07/03/2014 07/10/2014 07/11/2014  Navigator Encounter Type Treatment Treatment Treatment Treatment Treatment Telephone  Patient Visit Type - Radonc Radonc Radonc Radonc Radonc  Treatment Phase Treatment Treatment Treatment Treatment Treatment Final Radiation Tx- Celebrated ringing of the bell with patient and his wife. He states it feels great to be finished. I informed them that I will follow up with them and to please call me with any questions or concerns. They voiced understanding.  Barriers/Navigation Needs No barriers at this time No barriers at this time No barriers at this time No barriers at this time No barriers at this time No barriers at this time  Interventions None required - - - - -  Time Spent with Patient 15 15 15 15 15  15

## 2014-07-14 DIAGNOSIS — E1121 Type 2 diabetes mellitus with diabetic nephropathy: Secondary | ICD-10-CM | POA: Diagnosis not present

## 2014-07-16 NOTE — Progress Notes (Signed)
  Radiation Oncology         [phone]) [phone] ________________________________    Name: Victor Goodman MRN: [id number]  Date: 07/11/2014  DOB: 1940/09/26  End of Treatment Note  Diagnosis:   74 y.o. gentleman with stage T1c adenocarcinoma of the prostate with a Gleason's score of 4+4 and a PSA of 10.35     Indication for treatment:  Curative, Definitive Radiotherapy       Radiation treatment dates:      05/16/2014-07/11/2014  Site/dose:  1. The prostate, seminal vesicles, and pelvic lymph nodes were initially treated to 45 Gy in 25 fractions of 1.8 Gy  2. The prostate only was boosted to 75 Gy with 15 additional fractions of 2.0 Gy   Beams/energy:  1. The prostate, seminal vesicles, and pelvic lymph nodes were initially treated using VMAT intensity modulated radiotherapy delivering 6 megavolt photons. Image guidance was performed with CB CT studies prior to each fraction. He was immobilized with a body fix lower extremity mold.  2. the prostate only was boosted using VMAT intensity modulated radiotherapy delivering 6 megavolt photons. Image guidance was performed with CB CT studies prior to each fraction. He was immobilized with a body fix lower extremity mold.  Narrative: The patient tolerated radiation treatment relatively well.   The patient experienced some minor urinary irritation and modest fatigue.    Plan: The patient has completed radiation treatment. He will return to radiation oncology clinic for routine followup in one month. I advised him to call or return sooner if he has any questions or concerns related to his recovery or treatment. ________________________________  Sheral Apley. Tammi Klippel, M.D.

## 2014-08-10 ENCOUNTER — Ambulatory Visit
Admission: RE | Admit: 2014-08-10 | Discharge: 2014-08-10 | Disposition: A | Discharge status: Home / Self Care | Payer: Medicare PPO | Source: Ambulatory Visit | Attending: Radiation Oncology | Admitting: Radiation Oncology

## 2014-08-10 ENCOUNTER — Encounter: Payer: Self-pay | Admitting: Radiation Oncology

## 2014-08-10 VITALS — BP 122/56 | HR 84 | Temp 98.8°F | Resp 20 | Ht 71.0 in | Wt 245.9 lb

## 2014-08-10 DIAGNOSIS — C61 Malignant neoplasm of prostate: Secondary | ICD-10-CM

## 2014-08-10 MED ORDER — METHYLPREDNISOLONE 4 MG PO TBPK: ORAL_TABLET | ORAL | Status: DC

## 2014-08-10 NOTE — Progress Notes (Signed)
Radiation Oncology         [id number]) [phone] ________________________________  Name: Victor Goodman MRN: [id number]  Date: 08/10/2014  DOB: 1940-05-31  Follow-Up Visit Note  CC: Victor Casco, MD  Victor Bring, MD  Diagnosis: Victor Goodman is a 74 year old male presenting to clinic in regards to his stage T1c adenocarcinoma of the prostate with a Gleason's score of 4+4 and a PSA of 10.35.  Interval Since Last Radiation:  1  months  Narrative:  The patient returns today for routine follow-up appointment after treatment for his prostate cancer. He continues to have chronic lower back pain.  He reports he is not urinating as frequently and no longer experiencing diahrrea. He is self administering the prescribed medication Flomax and also self administers Imodium as needed.  He denies dysuria and hematuria. He reports his fatigue is improving.  He also reports having hives occasionally since finishing radiation. He also has a fever blister on his right bottom lip in which he is treating with over the counter ointment on it. The patient projected a healthy mental disposition at today's visit but was not accompanied by company. The patient also vocalized a specific questions in regards to what treatment is best to treat his current poison ivy.         ALLERGIES:  has No Known Allergies.  Meds: Current Outpatient Prescriptions  Medication Sig Dispense Refill  . ACCU-CHEK FASTCLIX LANCETS MISC     . ACCU-CHEK SMARTVIEW test strip     . aspirin 325 MG tablet Take 325 mg by mouth daily.    Marland Kitchen atorvastatin (LIPITOR) 20 MG tablet Take 20 mg by mouth daily.    . Blood Glucose Calibration (ACCU-CHEK SMARTVIEW CONTROL) LIQD     . Blood Glucose Monitoring Suppl (ACCU-CHEK NANO SMARTVIEW) W/DEVICE KIT     . Cholecalciferol (VITAMIN D3) 2000 UNITS TABS Take by mouth.    Marland Kitchen ibuprofen (ADVIL,MOTRIN) 200 MG tablet Take 400 mg by mouth every 6 (six) hours as needed.    . Lancets Misc. (ACCU-CHEK  FASTCLIX LANCET) KIT     . losartan-hydrochlorothiazide (HYZAAR) 100-12.5 MG per tablet Take 1 tablet by mouth daily.    . metFORMIN (GLUCOPHAGE) 500 MG tablet     . Multiple Vitamin (MULTIVITAMIN WITH MINERALS) TABS Take 1 tablet by mouth daily.    . Omega-3 Fatty Acids (FISH OIL) 1000 MG CPDR Take by mouth.    Marland Kitchen omeprazole (PRILOSEC) 40 MG capsule Take 40 mg by mouth daily.    . Selenium 200 MCG CAPS Take by mouth.    . tamsulosin (FLOMAX) 0.4 MG CAPS capsule Take 1 capsule (0.4 mg total) by mouth daily after supper. 30 capsule 5  . zinc gluconate 50 MG tablet Take 50 mg by mouth daily.    . methylPREDNISolone (MEDROL DOSEPAK) 4 MG TBPK tablet Take as directed 21 tablet 0   No current facility-administered medications for this encounter.    Physical Findings: The patient is in no acute distress. Patient is alert and oriented.  height is 5' 11"  (1.803 m) and weight is 245 lb 14.4 oz (111.54 kg). His oral temperature is 98.8 F (37.1 C). His blood pressure is 122/56 and his pulse is 84. His respiration is 20. Marland Kitchen  No significant changes.  Lab Findings: Lab Results  Component Value Date   WBC 9.9 08/17/2011   HGB 12.5* 08/17/2011   HCT 36.7* 08/17/2011   PLT 175 08/17/2011    Lab Results  Component Value  Date   NA 141 08/15/2011   K 3.9 08/15/2011   CO2 22 08/15/2011   GLUCOSE 144* 08/15/2011   BUN 17 08/15/2011   CREATININE 0.80 08/15/2011   BILITOT 1.4* 08/15/2011   ALKPHOS 52 08/15/2011   AST 41* 08/15/2011   ALT 51 08/15/2011   PROT 6.8 08/15/2011   ALBUMIN 4.2 08/15/2011   CALCIUM 9.1 08/15/2011    Radiographic Findings: No results found.  Impression:  Victor Goodman is a 74 year old male presenting to clinic in regards to his stage T1c adenocarcinoma of the prostate with a Gleason's score of 4+4 and a PSA of 10.35. The patient is recovering from the effects of radiation.  Plan:  He will continue to follow-up with urology for ongoing PSA determinations.  I will  look forward to following his response through their correspondence, and be happy to participate in care if clinically indicated.  I talked to the patient about what to expect in the future, including his risk for erectile dysfunction and rectal bleeding.  I encouraged him to call or return to the office if he has any question about his previous radiation or possible radiation effects.  He was comfortable with this plan.  Given Medrol Dose Pack for poison ivy.  This document serves as a record of services personally performed by Tyler Pita, MD. It was created on his behalf by Lenn Cal, a trained medical scribe. The creation of this record is based on the scribe's personal observations and the provider's statements to them. This document has been checked and approved by the attending provider. _____________________________________  Sheral Apley. Tammi Klippel, M.D.

## 2014-08-10 NOTE — Progress Notes (Signed)
Harrold Donath here for follow up after treatment for prostate cancer.  He denies having any new pain.  He continues to have chronic lower back pain.  He reports he is not urinating as frequently.  He is now getting up 1-2 per night.  He is taking Flomax.  He denies dysuria and hematuria.  He reports occasional diarrhea but is becoming less frequent.  He takes Imodium as needed.  He reports his fatigue is improving.  He also reports having hives occasionally since finishing radiation.  He also has a fever blister on his right bottom lip.  He is putting over the counter ointment on it.  BP 122/56 mmHg  Pulse 84  Temp(Src) 98.8 F (37.1 C) (Oral)  Resp 20  Ht 5\' 11"  (1.803 m)  Wt 245 lb 14.4 oz (111.54 kg)  BMI 34.31 kg/m2

## 2014-08-11 DIAGNOSIS — C61 Malignant neoplasm of prostate: Secondary | ICD-10-CM | POA: Diagnosis not present

## 2014-08-11 DIAGNOSIS — N401 Enlarged prostate with lower urinary tract symptoms: Secondary | ICD-10-CM | POA: Diagnosis not present

## 2014-08-25 DIAGNOSIS — C61 Malignant neoplasm of prostate: Secondary | ICD-10-CM | POA: Diagnosis not present

## 2014-09-14 ENCOUNTER — Telehealth: Payer: Self-pay | Admitting: Medical Oncology

## 2014-09-14 NOTE — Telephone Encounter (Signed)
Left a message requesting a return call. Just following up on Victor Goodman 3 month post radiation treatments.

## 2014-09-27 ENCOUNTER — Telehealth: Payer: Self-pay | Admitting: Medical Oncology

## 2014-09-27 NOTE — Telephone Encounter (Signed)
Left a message requesting a return call. I am following you with Victor Goodman 3 months post radiation treatments.

## 2014-10-16 DIAGNOSIS — N5201 Erectile dysfunction due to arterial insufficiency: Secondary | ICD-10-CM | POA: Diagnosis not present

## 2014-10-16 DIAGNOSIS — Z0000 Encounter for general adult medical examination without abnormal findings: Secondary | ICD-10-CM | POA: Diagnosis not present

## 2014-10-16 DIAGNOSIS — C61 Malignant neoplasm of prostate: Secondary | ICD-10-CM | POA: Diagnosis not present

## 2014-10-16 DIAGNOSIS — I129 Hypertensive chronic kidney disease with stage 1 through stage 4 chronic kidney disease, or unspecified chronic kidney disease: Secondary | ICD-10-CM | POA: Diagnosis not present

## 2014-10-16 DIAGNOSIS — Z23 Encounter for immunization: Secondary | ICD-10-CM | POA: Diagnosis not present

## 2014-10-16 DIAGNOSIS — E1121 Type 2 diabetes mellitus with diabetic nephropathy: Secondary | ICD-10-CM | POA: Diagnosis not present

## 2014-10-16 DIAGNOSIS — N182 Chronic kidney disease, stage 2 (mild): Secondary | ICD-10-CM | POA: Diagnosis not present

## 2014-10-16 DIAGNOSIS — E7800 Pure hypercholesterolemia, unspecified: Secondary | ICD-10-CM | POA: Diagnosis not present

## 2014-10-18 DIAGNOSIS — R739 Hyperglycemia, unspecified: Secondary | ICD-10-CM | POA: Diagnosis not present

## 2014-11-10 ENCOUNTER — Telehealth: Payer: Self-pay | Admitting: Medical Oncology

## 2014-11-10 NOTE — Telephone Encounter (Signed)
Oncology Nurse Navigator Documentation  Oncology Nurse Navigator Flowsheets 07/11/2014 09/27/2014 11/10/2014  Navigator Encounter Type Telephone Telephone;3 month Telephone;6 month-Left a message requesting a return call. I left several messages at the 3 month follow up without  a return call.  Patient Visit Type - Follow-up -  Barriers/Navigation Needs No barriers at this time - -

## 2015-01-09 DIAGNOSIS — C61 Malignant neoplasm of prostate: Secondary | ICD-10-CM | POA: Diagnosis not present

## 2015-01-12 DIAGNOSIS — C61 Malignant neoplasm of prostate: Secondary | ICD-10-CM | POA: Diagnosis not present

## 2015-02-16 DIAGNOSIS — Z86010 Personal history of colonic polyps: Secondary | ICD-10-CM | POA: Diagnosis not present

## 2015-02-16 DIAGNOSIS — D124 Benign neoplasm of descending colon: Secondary | ICD-10-CM | POA: Diagnosis not present

## 2015-02-16 DIAGNOSIS — D126 Benign neoplasm of colon, unspecified: Secondary | ICD-10-CM | POA: Diagnosis not present

## 2015-02-20 ENCOUNTER — Other Ambulatory Visit: Payer: Self-pay | Admitting: Radiation Oncology

## 2015-02-23 DIAGNOSIS — L570 Actinic keratosis: Secondary | ICD-10-CM | POA: Diagnosis not present

## 2015-04-16 DIAGNOSIS — N400 Enlarged prostate without lower urinary tract symptoms: Secondary | ICD-10-CM | POA: Diagnosis not present

## 2015-04-16 DIAGNOSIS — I129 Hypertensive chronic kidney disease with stage 1 through stage 4 chronic kidney disease, or unspecified chronic kidney disease: Secondary | ICD-10-CM | POA: Diagnosis not present

## 2015-04-16 DIAGNOSIS — Z794 Long term (current) use of insulin: Secondary | ICD-10-CM | POA: Diagnosis not present

## 2015-04-16 DIAGNOSIS — C61 Malignant neoplasm of prostate: Secondary | ICD-10-CM | POA: Diagnosis not present

## 2015-04-16 DIAGNOSIS — Z7984 Long term (current) use of oral hypoglycemic drugs: Secondary | ICD-10-CM | POA: Diagnosis not present

## 2015-04-16 DIAGNOSIS — N182 Chronic kidney disease, stage 2 (mild): Secondary | ICD-10-CM | POA: Diagnosis not present

## 2015-04-16 DIAGNOSIS — E7800 Pure hypercholesterolemia, unspecified: Secondary | ICD-10-CM | POA: Diagnosis not present

## 2015-04-16 DIAGNOSIS — E1121 Type 2 diabetes mellitus with diabetic nephropathy: Secondary | ICD-10-CM | POA: Diagnosis not present

## 2015-05-11 DIAGNOSIS — C61 Malignant neoplasm of prostate: Secondary | ICD-10-CM | POA: Diagnosis not present

## 2015-05-18 DIAGNOSIS — R3915 Urgency of urination: Secondary | ICD-10-CM | POA: Diagnosis not present

## 2015-05-18 DIAGNOSIS — C61 Malignant neoplasm of prostate: Secondary | ICD-10-CM | POA: Diagnosis not present

## 2015-05-18 DIAGNOSIS — Z0000 Encounter for general adult medical examination without abnormal findings: Secondary | ICD-10-CM | POA: Diagnosis not present

## 2015-05-18 DIAGNOSIS — N401 Enlarged prostate with lower urinary tract symptoms: Secondary | ICD-10-CM | POA: Diagnosis not present

## 2015-05-18 DIAGNOSIS — N138 Other obstructive and reflux uropathy: Secondary | ICD-10-CM | POA: Diagnosis not present

## 2015-06-25 DIAGNOSIS — L570 Actinic keratosis: Secondary | ICD-10-CM | POA: Diagnosis not present

## 2015-06-25 DIAGNOSIS — D225 Melanocytic nevi of trunk: Secondary | ICD-10-CM | POA: Diagnosis not present

## 2015-06-25 DIAGNOSIS — D0322 Melanoma in situ of left ear and external auricular canal: Secondary | ICD-10-CM | POA: Diagnosis not present

## 2015-06-25 DIAGNOSIS — Z86018 Personal history of other benign neoplasm: Secondary | ICD-10-CM | POA: Diagnosis not present

## 2015-06-25 DIAGNOSIS — D0439 Carcinoma in situ of skin of other parts of face: Secondary | ICD-10-CM | POA: Diagnosis not present

## 2015-06-25 DIAGNOSIS — D485 Neoplasm of uncertain behavior of skin: Secondary | ICD-10-CM | POA: Diagnosis not present

## 2015-06-25 DIAGNOSIS — Z85828 Personal history of other malignant neoplasm of skin: Secondary | ICD-10-CM | POA: Diagnosis not present

## 2015-06-25 DIAGNOSIS — L821 Other seborrheic keratosis: Secondary | ICD-10-CM | POA: Diagnosis not present

## 2015-06-25 DIAGNOSIS — D2272 Melanocytic nevi of left lower limb, including hip: Secondary | ICD-10-CM | POA: Diagnosis not present

## 2015-06-25 DIAGNOSIS — C44519 Basal cell carcinoma of skin of other part of trunk: Secondary | ICD-10-CM | POA: Diagnosis not present

## 2015-07-26 DIAGNOSIS — E1121 Type 2 diabetes mellitus with diabetic nephropathy: Secondary | ICD-10-CM | POA: Diagnosis not present

## 2015-07-26 DIAGNOSIS — Z7984 Long term (current) use of oral hypoglycemic drugs: Secondary | ICD-10-CM | POA: Diagnosis not present

## 2015-07-30 DIAGNOSIS — D0322 Melanoma in situ of left ear and external auricular canal: Secondary | ICD-10-CM | POA: Diagnosis not present

## 2015-07-30 DIAGNOSIS — C44519 Basal cell carcinoma of skin of other part of trunk: Secondary | ICD-10-CM | POA: Diagnosis not present

## 2015-07-30 DIAGNOSIS — D0430 Carcinoma in situ of skin of unspecified part of face: Secondary | ICD-10-CM | POA: Diagnosis not present

## 2015-08-16 DIAGNOSIS — D0322 Melanoma in situ of left ear and external auricular canal: Secondary | ICD-10-CM | POA: Diagnosis not present

## 2015-08-16 DIAGNOSIS — D485 Neoplasm of uncertain behavior of skin: Secondary | ICD-10-CM | POA: Diagnosis not present

## 2015-09-14 DIAGNOSIS — C61 Malignant neoplasm of prostate: Secondary | ICD-10-CM | POA: Diagnosis not present

## 2015-09-21 DIAGNOSIS — C61 Malignant neoplasm of prostate: Secondary | ICD-10-CM | POA: Diagnosis not present

## 2015-10-17 DIAGNOSIS — I129 Hypertensive chronic kidney disease with stage 1 through stage 4 chronic kidney disease, or unspecified chronic kidney disease: Secondary | ICD-10-CM | POA: Diagnosis not present

## 2015-10-17 DIAGNOSIS — N182 Chronic kidney disease, stage 2 (mild): Secondary | ICD-10-CM | POA: Diagnosis not present

## 2015-10-17 DIAGNOSIS — C61 Malignant neoplasm of prostate: Secondary | ICD-10-CM | POA: Diagnosis not present

## 2015-10-17 DIAGNOSIS — N5201 Erectile dysfunction due to arterial insufficiency: Secondary | ICD-10-CM | POA: Diagnosis not present

## 2015-10-17 DIAGNOSIS — E1121 Type 2 diabetes mellitus with diabetic nephropathy: Secondary | ICD-10-CM | POA: Diagnosis not present

## 2015-10-17 DIAGNOSIS — E7800 Pure hypercholesterolemia, unspecified: Secondary | ICD-10-CM | POA: Diagnosis not present

## 2015-10-17 DIAGNOSIS — K219 Gastro-esophageal reflux disease without esophagitis: Secondary | ICD-10-CM | POA: Diagnosis not present

## 2015-10-17 DIAGNOSIS — D039 Melanoma in situ, unspecified: Secondary | ICD-10-CM | POA: Diagnosis not present

## 2015-10-17 DIAGNOSIS — Z0000 Encounter for general adult medical examination without abnormal findings: Secondary | ICD-10-CM | POA: Diagnosis not present

## 2015-10-17 DIAGNOSIS — Z23 Encounter for immunization: Secondary | ICD-10-CM | POA: Diagnosis not present

## 2015-10-23 DIAGNOSIS — R739 Hyperglycemia, unspecified: Secondary | ICD-10-CM | POA: Diagnosis not present

## 2016-02-14 DIAGNOSIS — L570 Actinic keratosis: Secondary | ICD-10-CM | POA: Diagnosis not present

## 2016-02-14 DIAGNOSIS — L820 Inflamed seborrheic keratosis: Secondary | ICD-10-CM | POA: Diagnosis not present

## 2016-02-14 DIAGNOSIS — L814 Other melanin hyperpigmentation: Secondary | ICD-10-CM | POA: Diagnosis not present

## 2016-02-14 DIAGNOSIS — L821 Other seborrheic keratosis: Secondary | ICD-10-CM | POA: Diagnosis not present

## 2016-02-14 DIAGNOSIS — D225 Melanocytic nevi of trunk: Secondary | ICD-10-CM | POA: Diagnosis not present

## 2016-02-14 DIAGNOSIS — Z86018 Personal history of other benign neoplasm: Secondary | ICD-10-CM | POA: Diagnosis not present

## 2016-02-14 DIAGNOSIS — Z85820 Personal history of malignant melanoma of skin: Secondary | ICD-10-CM | POA: Diagnosis not present

## 2016-02-14 DIAGNOSIS — D1800 Hemangioma unspecified site: Secondary | ICD-10-CM | POA: Diagnosis not present

## 2016-02-14 DIAGNOSIS — Z85828 Personal history of other malignant neoplasm of skin: Secondary | ICD-10-CM | POA: Diagnosis not present

## 2016-04-15 DIAGNOSIS — C61 Malignant neoplasm of prostate: Secondary | ICD-10-CM | POA: Diagnosis not present

## 2016-04-16 DIAGNOSIS — I129 Hypertensive chronic kidney disease with stage 1 through stage 4 chronic kidney disease, or unspecified chronic kidney disease: Secondary | ICD-10-CM | POA: Diagnosis not present

## 2016-04-16 DIAGNOSIS — E7800 Pure hypercholesterolemia, unspecified: Secondary | ICD-10-CM | POA: Diagnosis not present

## 2016-04-16 DIAGNOSIS — Z7984 Long term (current) use of oral hypoglycemic drugs: Secondary | ICD-10-CM | POA: Diagnosis not present

## 2016-04-16 DIAGNOSIS — N182 Chronic kidney disease, stage 2 (mild): Secondary | ICD-10-CM | POA: Diagnosis not present

## 2016-04-16 DIAGNOSIS — C61 Malignant neoplasm of prostate: Secondary | ICD-10-CM | POA: Diagnosis not present

## 2016-04-16 DIAGNOSIS — E1121 Type 2 diabetes mellitus with diabetic nephropathy: Secondary | ICD-10-CM | POA: Diagnosis not present

## 2016-04-21 DIAGNOSIS — N3281 Overactive bladder: Secondary | ICD-10-CM | POA: Diagnosis not present

## 2016-04-21 DIAGNOSIS — R351 Nocturia: Secondary | ICD-10-CM | POA: Diagnosis not present

## 2016-04-21 DIAGNOSIS — C61 Malignant neoplasm of prostate: Secondary | ICD-10-CM | POA: Diagnosis not present

## 2016-05-22 ENCOUNTER — Encounter: Payer: Self-pay | Admitting: Dietician

## 2016-05-22 ENCOUNTER — Encounter: Payer: Medicare PPO | Attending: Family Medicine | Admitting: Dietician

## 2016-05-22 DIAGNOSIS — Z713 Dietary counseling and surveillance: Secondary | ICD-10-CM | POA: Diagnosis not present

## 2016-05-22 DIAGNOSIS — I10 Essential (primary) hypertension: Secondary | ICD-10-CM | POA: Diagnosis not present

## 2016-05-22 DIAGNOSIS — E119 Type 2 diabetes mellitus without complications: Secondary | ICD-10-CM | POA: Insufficient documentation

## 2016-05-22 DIAGNOSIS — E7800 Pure hypercholesterolemia, unspecified: Secondary | ICD-10-CM | POA: Diagnosis not present

## 2016-05-22 DIAGNOSIS — E118 Type 2 diabetes mellitus with unspecified complications: Secondary | ICD-10-CM

## 2016-05-22 NOTE — Progress Notes (Signed)
Diabetes Self-Management Education  Visit Type: First/Initial  Appt. Start Time: 1415 Appt. End Time: 2355  05/22/2016  Mr. Victor Goodman, identified by name and date of birth, is a 76 y.o. male with a diagnosis of Diabetes: Type 2.  He has history of HLP, HTN, and prostate cancer.  He is motivated to lose weight.  He desires a 20 lb weight loss which he put on in the last year as a side effect from hormone cancer treatments.  Primary concerns today: diabetes and weight management.  Patient is here today with his wife who does the food preparation and cooking and they both do the grocery shopping.  They eat out about 2-3x/month together and he goes out 1x/week for lunch.  At home they eat in the living room while watching TV.  His wife is very supportive in helping him lose weight and control his blood sugar levels.  They attended our Core Diabetes Classes in 2015 and request a review of the nutrition recommendations today.  ASSESSMENT      Diabetes Self-Management Education - 05/22/16 1525      Visit Information   Visit Type First/Initial     Initial Visit   Diabetes Type Type 2   Are you currently following a meal plan? No   Are you taking your medications as prescribed? Yes   Date Diagnosed 2015     Health Coping   How would you rate your overall health? Fair     Psychosocial Assessment   Patient Belief/Attitude about Diabetes Motivated to manage diabetes   Self-management support Family;Doctor's office   Other persons present Patient;Spouse/SO   Patient Concerns Nutrition/Meal planning;Weight Control;Healthy Lifestyle   Special Needs None   Preferred Learning Style No preference indicated   What is the last grade level you completed in school? 4 year college     Complications   Last HgB A1C per patient/outside source 7.5 %   How often do you check your blood sugar? 1-2 times/day   Have you had a dilated eye exam in the past 12 months? Yes   Have you had a dental exam in  the past 12 months? Yes   Are you checking your feet? Yes   How many days per week are you checking your feet? 7     Dietary Intake   Breakfast two cups of black coffee, 2 hard boiled eggs, 2 pieces of bacon, occasionally slice of whole wheat toast, OR 1 cup steel oats, with 1/2 cup OJ    Snack (morning)  sometimes a piece of fruit like apple or grapes    Lunch whole grain sandwich with black forest ham, lettuce, tomato, onion, olive oil mayo, slice of cheese, water OR 1x/week out to eat for hamburger or sandwich or [employer] pizza   Snack (afternoon)  pistachios or gold fish or fruit   Dinner grilled chicken or hamburger, fish 1x/week like baked salmon with olive oil and pistachios, with baked potato, asparagus or squash   Snack (evening) usually none, sometimes a fruit like apple or plum   Beverage(s) 32-48 oz of water per day, 16 oz black coffee per day, 4 oz OJ per day, occasionally unsweet tea, 2 beers 1x/week, avoids sugary beverages     Exercise   Exercise Type ADL's   How many days per week to you exercise? 0   How many minutes per day do you exercise? 0   Total minutes per week of exercise 0     Patient  Education   Previous Diabetes Education Yes (please comment)  Completed Core Diabetes Classes when diagnosed in 2015   Nutrition management  Role of diet in the treatment of diabetes and the relationship between the three main macronutrients and blood glucose level;Food label reading, portion sizes and measuring food.;Carbohydrate counting   Physical activity and exercise  Role of exercise on diabetes management, blood pressure control and cardiac health.;Helped patient identify appropriate exercises in relation to his/her diabetes, diabetes complications and other health issue.   Psychosocial adjustment Role of stress on diabetes     Individualized Goals (developed by patient)   Nutrition Follow meal plan discussed;General guidelines for healthy choices and portions discussed    Physical Activity Exercise 1-2 times per week;45 minutes per day     Outcomes   Expected Outcomes Demonstrated interest in learning. Expect positive outcomes   Future DMSE PRN   Program Status Completed      Individualized Plan for Diabetes Self-Management Training:   Learning Objective:  Patient will have a greater understanding of diabetes self-management and strategies for sustainable weight management.  Plan:   Follow Diabetes Meal Plan as instructed  Eat 3 meals and 2-3 snacks, every 3-5 hrs  Limit carbohydrate intake to 30-45 grams carbohydrate/meal  Limit carbohydrate intake to 15-30 grams carbohydrate/snack  Add lean protein foods to meals/snacks  Monitor glucose levels as instructed by your doctor  Aim for 90-150 mins of physical activity weekly   Expected Outcomes:  Demonstrated interest in learning. Expect positive outcomes  Education material provided: Living Well with Diabetes, A1C conversion sheet, Meal plan card, My Plate, Snack sheet, Support group flyer and No sodium seasonings  If problems or questions, patient to contact team via:  Phone and Email  Future DSME appointment: PRN

## 2016-07-21 DIAGNOSIS — E1121 Type 2 diabetes mellitus with diabetic nephropathy: Secondary | ICD-10-CM | POA: Diagnosis not present

## 2016-07-21 DIAGNOSIS — Z7984 Long term (current) use of oral hypoglycemic drugs: Secondary | ICD-10-CM | POA: Diagnosis not present

## 2016-10-06 DIAGNOSIS — B353 Tinea pedis: Secondary | ICD-10-CM | POA: Diagnosis not present

## 2016-10-06 DIAGNOSIS — L821 Other seborrheic keratosis: Secondary | ICD-10-CM | POA: Diagnosis not present

## 2016-10-06 DIAGNOSIS — Z85820 Personal history of malignant melanoma of skin: Secondary | ICD-10-CM | POA: Diagnosis not present

## 2016-10-06 DIAGNOSIS — D2362 Other benign neoplasm of skin of left upper limb, including shoulder: Secondary | ICD-10-CM | POA: Diagnosis not present

## 2016-10-06 DIAGNOSIS — D0421 Carcinoma in situ of skin of right ear and external auricular canal: Secondary | ICD-10-CM | POA: Diagnosis not present

## 2016-10-06 DIAGNOSIS — D235 Other benign neoplasm of skin of trunk: Secondary | ICD-10-CM | POA: Diagnosis not present

## 2016-10-06 DIAGNOSIS — L570 Actinic keratosis: Secondary | ICD-10-CM | POA: Diagnosis not present

## 2016-10-06 DIAGNOSIS — D2371 Other benign neoplasm of skin of right lower limb, including hip: Secondary | ICD-10-CM | POA: Diagnosis not present

## 2016-10-06 DIAGNOSIS — L918 Other hypertrophic disorders of the skin: Secondary | ICD-10-CM | POA: Diagnosis not present

## 2016-10-06 DIAGNOSIS — D044 Carcinoma in situ of skin of scalp and neck: Secondary | ICD-10-CM | POA: Diagnosis not present

## 2016-10-06 DIAGNOSIS — D2272 Melanocytic nevi of left lower limb, including hip: Secondary | ICD-10-CM | POA: Diagnosis not present

## 2016-10-15 DIAGNOSIS — C61 Malignant neoplasm of prostate: Secondary | ICD-10-CM | POA: Diagnosis not present

## 2016-10-17 DIAGNOSIS — Z7984 Long term (current) use of oral hypoglycemic drugs: Secondary | ICD-10-CM | POA: Diagnosis not present

## 2016-10-17 DIAGNOSIS — Z23 Encounter for immunization: Secondary | ICD-10-CM | POA: Diagnosis not present

## 2016-10-17 DIAGNOSIS — I129 Hypertensive chronic kidney disease with stage 1 through stage 4 chronic kidney disease, or unspecified chronic kidney disease: Secondary | ICD-10-CM | POA: Diagnosis not present

## 2016-10-17 DIAGNOSIS — Z0000 Encounter for general adult medical examination without abnormal findings: Secondary | ICD-10-CM | POA: Diagnosis not present

## 2016-10-17 DIAGNOSIS — Z1389 Encounter for screening for other disorder: Secondary | ICD-10-CM | POA: Diagnosis not present

## 2016-10-17 DIAGNOSIS — E1121 Type 2 diabetes mellitus with diabetic nephropathy: Secondary | ICD-10-CM | POA: Diagnosis not present

## 2016-10-17 DIAGNOSIS — E7800 Pure hypercholesterolemia, unspecified: Secondary | ICD-10-CM | POA: Diagnosis not present

## 2016-10-17 DIAGNOSIS — N182 Chronic kidney disease, stage 2 (mild): Secondary | ICD-10-CM | POA: Diagnosis not present

## 2016-10-17 DIAGNOSIS — K219 Gastro-esophageal reflux disease without esophagitis: Secondary | ICD-10-CM | POA: Diagnosis not present

## 2016-10-24 DIAGNOSIS — E119 Type 2 diabetes mellitus without complications: Secondary | ICD-10-CM | POA: Diagnosis not present

## 2016-10-24 DIAGNOSIS — H43813 Vitreous degeneration, bilateral: Secondary | ICD-10-CM | POA: Diagnosis not present

## 2016-10-29 DIAGNOSIS — C775 Secondary and unspecified malignant neoplasm of intrapelvic lymph nodes: Secondary | ICD-10-CM | POA: Diagnosis not present

## 2016-10-29 DIAGNOSIS — C61 Malignant neoplasm of prostate: Secondary | ICD-10-CM | POA: Diagnosis not present

## 2017-04-06 DIAGNOSIS — J209 Acute bronchitis, unspecified: Secondary | ICD-10-CM | POA: Diagnosis not present

## 2017-04-06 DIAGNOSIS — J069 Acute upper respiratory infection, unspecified: Secondary | ICD-10-CM | POA: Diagnosis not present

## 2017-04-17 DIAGNOSIS — E1121 Type 2 diabetes mellitus with diabetic nephropathy: Secondary | ICD-10-CM | POA: Diagnosis not present

## 2017-04-17 DIAGNOSIS — N182 Chronic kidney disease, stage 2 (mild): Secondary | ICD-10-CM | POA: Diagnosis not present

## 2017-04-17 DIAGNOSIS — E7800 Pure hypercholesterolemia, unspecified: Secondary | ICD-10-CM | POA: Diagnosis not present

## 2017-04-17 DIAGNOSIS — I129 Hypertensive chronic kidney disease with stage 1 through stage 4 chronic kidney disease, or unspecified chronic kidney disease: Secondary | ICD-10-CM | POA: Diagnosis not present

## 2017-04-22 DIAGNOSIS — C61 Malignant neoplasm of prostate: Secondary | ICD-10-CM | POA: Diagnosis not present

## 2017-04-23 DIAGNOSIS — M4807 Spinal stenosis, lumbosacral region: Secondary | ICD-10-CM | POA: Diagnosis not present

## 2017-04-23 DIAGNOSIS — M5416 Radiculopathy, lumbar region: Secondary | ICD-10-CM | POA: Diagnosis not present

## 2017-04-29 DIAGNOSIS — C61 Malignant neoplasm of prostate: Secondary | ICD-10-CM | POA: Diagnosis not present

## 2017-04-29 DIAGNOSIS — N3281 Overactive bladder: Secondary | ICD-10-CM | POA: Diagnosis not present

## 2017-04-29 DIAGNOSIS — N5201 Erectile dysfunction due to arterial insufficiency: Secondary | ICD-10-CM | POA: Diagnosis not present

## 2017-04-30 ENCOUNTER — Other Ambulatory Visit: Payer: Self-pay | Admitting: Neurosurgery

## 2017-04-30 DIAGNOSIS — M5416 Radiculopathy, lumbar region: Secondary | ICD-10-CM

## 2017-05-05 ENCOUNTER — Ambulatory Visit
Admission: RE | Admit: 2017-05-05 | Discharge: 2017-05-05 | Disposition: A | Discharge status: Home / Self Care | Payer: Medicare PPO | Source: Ambulatory Visit | Attending: Neurosurgery | Admitting: Neurosurgery

## 2017-05-05 DIAGNOSIS — M5416 Radiculopathy, lumbar region: Secondary | ICD-10-CM

## 2017-05-05 DIAGNOSIS — M4807 Spinal stenosis, lumbosacral region: Secondary | ICD-10-CM | POA: Diagnosis not present

## 2017-05-05 DIAGNOSIS — M5117 Intervertebral disc disorders with radiculopathy, lumbosacral region: Secondary | ICD-10-CM | POA: Diagnosis not present

## 2017-05-05 MED ORDER — GADOBENATE DIMEGLUMINE 529 MG/ML IV SOLN
20.0000 mL | Freq: Once | INTRAVENOUS | Status: AC | PRN
  Administered 2017-05-05: 20 mL via INTRAVENOUS

## 2017-05-14 DIAGNOSIS — Z85828 Personal history of other malignant neoplasm of skin: Secondary | ICD-10-CM | POA: Diagnosis not present

## 2017-05-14 DIAGNOSIS — L821 Other seborrheic keratosis: Secondary | ICD-10-CM | POA: Diagnosis not present

## 2017-05-14 DIAGNOSIS — D485 Neoplasm of uncertain behavior of skin: Secondary | ICD-10-CM | POA: Diagnosis not present

## 2017-05-14 DIAGNOSIS — Z85820 Personal history of malignant melanoma of skin: Secondary | ICD-10-CM | POA: Diagnosis not present

## 2017-05-14 DIAGNOSIS — D2362 Other benign neoplasm of skin of left upper limb, including shoulder: Secondary | ICD-10-CM | POA: Diagnosis not present

## 2017-05-21 ENCOUNTER — Other Ambulatory Visit: Payer: Self-pay | Admitting: Neurosurgery

## 2017-05-21 DIAGNOSIS — M48062 Spinal stenosis, lumbar region with neurogenic claudication: Secondary | ICD-10-CM | POA: Diagnosis not present

## 2017-05-21 DIAGNOSIS — M4807 Spinal stenosis, lumbosacral region: Secondary | ICD-10-CM | POA: Diagnosis not present

## 2017-06-17 NOTE — Pre-Procedure Instructions (Signed)
Victor Goodman  06/17/2017      [facility] [address] [place], [place] - [address] [place] [address] Phone: [phone] Fax: [phone]    Your procedure is scheduled on June 14 .  Report to [facility] Admitting at 530 A.M.  Call this number if you have problems the morning of surgery:  [phone]   Remember:  No food or drink after midnight.  Take these medicines the morning of surgery with A SIP OF WATER  Omeprazole (Prilosec)   Stop taking aspirin as directed by your Dr.  Stop taking BC's, Goody's, herbal mediations, Vitamins, Ibuprofen, Advil, Motrin     How to Manage Your Diabetes Before and After Surgery  Why is it important to control my blood sugar before and after surgery? . Improving blood sugar levels before and after surgery helps healing and can limit problems. . A way of improving blood sugar control is eating a healthy diet by: o  Eating less sugar and carbohydrates o  Increasing activity/exercise o  Talking with your doctor about reaching your blood sugar goals . High blood sugars (greater than 180 mg/dL) can raise your risk of infections and slow your recovery, so you will need to focus on controlling your diabetes during the weeks before surgery. . Make sure that the doctor who takes care of your diabetes knows about your planned surgery including the date and location.  How do I manage my blood sugar before surgery? . Check your blood sugar at least 4 times a day, starting 2 days before surgery, to make sure that the level is not too high or low. o Check your blood sugar the morning of your surgery when you wake up and every 2 hours until you get to the Short Stay unit. . If your blood sugar is less than 70 mg/dL, you will need to treat for low blood sugar: o Do not take insulin. o Treat a low blood sugar (less than 70 mg/dL) with  cup of clear juice (cranberry or apple), 4 glucose  tablets, OR glucose gel. Recheck blood sugar in 15 minutes after treatment (to make sure it is greater than 70 mg/dL). If your blood sugar is not greater than 70 mg/dL on recheck, call [phone] o  for further instructions. . Report your blood sugar to the short stay nurse when you get to Short Stay.  . If you are admitted to the hospital after surgery: o Your blood sugar will be checked by the staff and you will probably be given insulin after surgery (instead of oral diabetes medicines) to make sure you have good blood sugar levels. o The goal for blood sugar control after surgery is 80-180 mg/dL.              WHAT DO I DO ABOUT MY DIABETES MEDICATION?   Marland Kitchen Do not take oral diabetes medicines (pills) the morning of surgery. Glimepiride (Amaryl), Metformin (Glucophage) Sitagliptin [name]    . THE NIGHT BEFORE SURGERY, take ___________ units of ___________insulin.       . THE MORNING OF SURGERY, take _____________ units of __________insulin.  . The day of surgery, do not take other diabetes injectables, including Byetta (exenatide), Bydureon (exenatide ER), Victoza (liraglutide), or Trulicity (dulaglutide).  . If your CBG is greater than 220 mg/dL, you may take  of your sliding scale (correction) dose of insulin.  Other Instructions:  Patient Signature:  Date:   Nurse Signature:  Date:   Reviewed and Endorsed by [facility] Patient Education Committee, August 2015  Do not wear jewelry, make-up or nail polish.  Do not wear lotions, powders, or perfumes, or deodorant.  Do not shave 48 hours prior to surgery.  Men may shave face and neck.  Do not bring valuables to the hospital.  [facility] is not responsible for any belongings or valuables.  Contacts, dentures or bridgework may not be worn into surgery.  Leave your suitcase in the car.  After surgery it may be brought to your room.  For patients admitted to the hospital, discharge time will be  determined by your treatment team.  Patients discharged the day of surgery will not be allowed to drive home.    Special instructions:   [place]- Preparing For Surgery  Before surgery, you can play an important role. Because skin is not sterile, your skin needs to be as free of germs as possible. You can reduce the number of germs on your skin by washing with CHG (chlorahexidine gluconate) Soap before surgery.  CHG is an antiseptic cleaner which kills germs and bonds with the skin to continue killing germs even after washing.    Oral Hygiene is also important to reduce your risk of infection.  Remember - BRUSH YOUR TEETH THE MORNING OF SURGERY WITH YOUR REGULAR TOOTHPASTE  Please do not use if you have an allergy to CHG or antibacterial soaps. If your skin becomes reddened/irritated stop using the CHG.  Do not shave (including legs and underarms) for at least 48 hours prior to first CHG shower. It is OK to shave your face.  Please follow these instructions carefully.   1. Shower the NIGHT BEFORE SURGERY and the MORNING OF SURGERY with CHG.   2. If you chose to wash your hair, wash your hair first as usual with your normal shampoo.  3. After you shampoo, rinse your hair and body thoroughly to remove the shampoo.  4. Use CHG as you would any other liquid soap. You can apply CHG directly to the skin and wash gently with a scrungie or a clean washcloth.   5. Apply the CHG Soap to your body ONLY FROM THE NECK DOWN.  Do not use on open wounds or open sores. Avoid contact with your eyes, ears, mouth and genitals (private parts). Wash Face and genitals (private parts)  with your normal soap.  6. Wash thoroughly, paying special attention to the area where your surgery will be performed.  7. Thoroughly rinse your body with warm water from the neck down.  8. DO NOT shower/wash with your normal soap after using and rinsing off the CHG Soap.  9. Pat yourself dry with a CLEAN  TOWEL.  10. Wear CLEAN PAJAMAS to bed the night before surgery, wear comfortable clothes the morning of surgery  11. Place CLEAN SHEETS on your bed the night of your first shower and DO NOT SLEEP WITH PETS.    Day of Surgery:  Do not apply any deodorants/lotions.  Please wear clean clothes to the hospital/surgery center.   Remember to brush your teeth WITH YOUR REGULAR TOOTHPASTE.    Please read over the following fact sheets that you were given. Pain Booklet, Coughing and Deep Breathing, MRSA Information and Surgical Site Infection Prevention

## 2017-06-18 ENCOUNTER — Other Ambulatory Visit: Payer: Self-pay

## 2017-06-18 ENCOUNTER — Encounter (HOSPITAL_COMMUNITY)
Admission: RE | Admit: 2017-06-18 | Discharge: 2017-06-18 | Disposition: A | Discharge status: Home / Self Care | Payer: Medicare PPO | Source: Ambulatory Visit | Attending: Neurosurgery | Admitting: Neurosurgery

## 2017-06-18 ENCOUNTER — Encounter (HOSPITAL_COMMUNITY): Payer: Self-pay

## 2017-06-18 DIAGNOSIS — E119 Type 2 diabetes mellitus without complications: Secondary | ICD-10-CM | POA: Insufficient documentation

## 2017-06-18 DIAGNOSIS — E785 Hyperlipidemia, unspecified: Secondary | ICD-10-CM | POA: Insufficient documentation

## 2017-06-18 DIAGNOSIS — Z01810 Encounter for preprocedural cardiovascular examination: Secondary | ICD-10-CM | POA: Diagnosis not present

## 2017-06-18 DIAGNOSIS — Z01812 Encounter for preprocedural laboratory examination: Secondary | ICD-10-CM | POA: Diagnosis not present

## 2017-06-18 DIAGNOSIS — K219 Gastro-esophageal reflux disease without esophagitis: Secondary | ICD-10-CM | POA: Insufficient documentation

## 2017-06-18 DIAGNOSIS — Z8546 Personal history of malignant neoplasm of prostate: Secondary | ICD-10-CM | POA: Diagnosis not present

## 2017-06-18 DIAGNOSIS — I10 Essential (primary) hypertension: Secondary | ICD-10-CM | POA: Diagnosis not present

## 2017-06-18 LAB — BASIC METABOLIC PANEL
ANION GAP: 11 (ref 5–15)
BUN: 19 mg/dL (ref 6–20)
CO2: 23 mmol/L (ref 22–32)
Calcium: 8.9 mg/dL (ref 8.9–10.3)
Chloride: 100 mmol/L — ABNORMAL LOW (ref 101–111)
Creatinine, Ser: 0.91 mg/dL (ref 0.61–1.24)
GFR calc Af Amer: >60 mL/min (ref >60)
Glucose, Bld: 135 mg/dL — ABNORMAL HIGH (ref 65–99)
Potassium: 4.5 mmol/L (ref 3.5–5.1)
SODIUM: 134 mmol/L — AB (ref 135–145)

## 2017-06-18 LAB — SURGICAL PCR SCREEN
MRSA, PCR: NEGATIVE
Staphylococcus aureus: POSITIVE — AB

## 2017-06-18 LAB — CBC
HCT: 42.2 % (ref 39.0–52.0)
HEMOGLOBIN: 14 g/dL (ref 13.0–17.0)
MCH: 28.3 pg (ref 26.0–34.0)
MCHC: 33.2 g/dL (ref 30.0–36.0)
MCV: 85.4 fL (ref 78.0–100.0)
Platelets: 260 10*3/uL (ref 150–400)
RBC: 4.94 MIL/uL (ref 4.22–5.81)
RDW: 12.4 % (ref 11.5–15.5)
WBC: 5.3 10*3/uL (ref 4.0–10.5)

## 2017-06-18 LAB — GLUCOSE, CAPILLARY: GLUCOSE-CAPILLARY: 141 mg/dL — AB (ref 65–99)

## 2017-06-18 LAB — HEMOGLOBIN A1C
Hgb A1c MFr Bld: 6.7 % — ABNORMAL HIGH (ref 4.8–5.6)
MEAN PLASMA GLUCOSE: 145.59 mg/dL

## 2017-06-18 NOTE — Progress Notes (Signed)
   06/18/17 0901  OBSTRUCTIVE SLEEP APNEA  Have you ever been diagnosed with sleep apnea through a sleep study? No  Do you snore loudly (loud enough to be heard through closed doors)?  0  Do you often feel tired, fatigued, or sleepy during the daytime (such as falling asleep during driving or talking to someone)? 0  Has anyone observed you stop breathing during your sleep? 0  Do you have, or are you being treated for high blood pressure? 1  BMI more than 35 kg/m2? 1  Age > 50 (1-yes) 1  Neck circumference greater than:Male 16 inches or larger, Male 17inches or larger? 1  Male Gender (Yes=1) 1  Obstructive Sleep Apnea Score 5  Score 5 or greater  Results sent to PCP

## 2017-06-18 NOTE — Progress Notes (Addendum)
PCP: Dr. Kelton Pillar No cardiologist  Fasting sugars: 120-140 Last hgb A1C 3-4 months ago  Pt. To contact Dr. Saintclair Halsted when to stop aspirin  Mupriocin ointment prescription called to [facility] on [address]

## 2017-06-26 ENCOUNTER — Inpatient Hospital Stay (HOSPITAL_COMMUNITY): Payer: Medicare PPO | Admitting: Anesthesiology

## 2017-06-26 ENCOUNTER — Inpatient Hospital Stay (HOSPITAL_COMMUNITY): Payer: Medicare PPO

## 2017-06-26 ENCOUNTER — Inpatient Hospital Stay (HOSPITAL_COMMUNITY)
Admission: RE | Admit: 2017-06-26 | Discharge: 2017-06-28 | DRG: 520 | Disposition: A | Discharge status: Home / Self Care | Payer: Medicare PPO | Source: Ambulatory Visit | Attending: Neurosurgery | Admitting: Neurosurgery

## 2017-06-26 ENCOUNTER — Encounter (HOSPITAL_COMMUNITY)
Admission: RE | Disposition: A | Discharge status: Home / Self Care | Payer: Self-pay | Source: Ambulatory Visit | Attending: Neurosurgery

## 2017-06-26 DIAGNOSIS — Z9849 Cataract extraction status, unspecified eye: Secondary | ICD-10-CM

## 2017-06-26 DIAGNOSIS — Z86010 Personal history of colonic polyps: Secondary | ICD-10-CM | POA: Diagnosis not present

## 2017-06-26 DIAGNOSIS — Z9089 Acquired absence of other organs: Secondary | ICD-10-CM

## 2017-06-26 DIAGNOSIS — Z7982 Long term (current) use of aspirin: Secondary | ICD-10-CM

## 2017-06-26 DIAGNOSIS — Z9049 Acquired absence of other specified parts of digestive tract: Secondary | ICD-10-CM

## 2017-06-26 DIAGNOSIS — E119 Type 2 diabetes mellitus without complications: Secondary | ICD-10-CM | POA: Diagnosis present

## 2017-06-26 DIAGNOSIS — M48062 Spinal stenosis, lumbar region with neurogenic claudication: Secondary | ICD-10-CM | POA: Diagnosis not present

## 2017-06-26 DIAGNOSIS — Z820 Family history of epilepsy and other diseases of the nervous system: Secondary | ICD-10-CM

## 2017-06-26 DIAGNOSIS — M5416 Radiculopathy, lumbar region: Secondary | ICD-10-CM | POA: Diagnosis not present

## 2017-06-26 DIAGNOSIS — E785 Hyperlipidemia, unspecified: Secondary | ICD-10-CM | POA: Diagnosis present

## 2017-06-26 DIAGNOSIS — Z808 Family history of malignant neoplasm of other organs or systems: Secondary | ICD-10-CM | POA: Diagnosis not present

## 2017-06-26 DIAGNOSIS — Z7984 Long term (current) use of oral hypoglycemic drugs: Secondary | ICD-10-CM

## 2017-06-26 DIAGNOSIS — I10 Essential (primary) hypertension: Secondary | ICD-10-CM | POA: Diagnosis present

## 2017-06-26 DIAGNOSIS — Z981 Arthrodesis status: Secondary | ICD-10-CM | POA: Diagnosis not present

## 2017-06-26 DIAGNOSIS — M48061 Spinal stenosis, lumbar region without neurogenic claudication: Secondary | ICD-10-CM | POA: Diagnosis not present

## 2017-06-26 DIAGNOSIS — K219 Gastro-esophageal reflux disease without esophagitis: Secondary | ICD-10-CM | POA: Diagnosis not present

## 2017-06-26 DIAGNOSIS — Z8546 Personal history of malignant neoplasm of prostate: Secondary | ICD-10-CM

## 2017-06-26 DIAGNOSIS — Z419 Encounter for procedure for purposes other than remedying health state, unspecified: Secondary | ICD-10-CM

## 2017-06-26 HISTORY — PX: LUMBAR LAMINECTOMY/DECOMPRESSION MICRODISCECTOMY: SHX5026

## 2017-06-26 LAB — GLUCOSE, CAPILLARY
GLUCOSE-CAPILLARY: 166 mg/dL — AB (ref 65–99)
GLUCOSE-CAPILLARY: 196 mg/dL — AB (ref 65–99)
Glucose-Capillary: 124 mg/dL — ABNORMAL HIGH (ref 65–99)
Glucose-Capillary: 175 mg/dL — ABNORMAL HIGH (ref 65–99)
Glucose-Capillary: 167 mg/dL — ABNORMAL HIGH (ref 65–99)

## 2017-06-26 SURGERY — LUMBAR LAMINECTOMY/DECOMPRESSION MICRODISCECTOMY 3 LEVELS
Anesthesia: General | Site: Back

## 2017-06-26 MED ORDER — 0.9 % SODIUM CHLORIDE (POUR BTL) OPTIME
TOPICAL | Status: DC | PRN
  Administered 2017-06-26: 1000 mL

## 2017-06-26 MED ORDER — HEMOSTATIC AGENTS (NO CHARGE) OPTIME
TOPICAL | Status: DC | PRN
  Administered 2017-06-26: 1 via TOPICAL

## 2017-06-26 MED ORDER — ROCURONIUM BROMIDE 10 MG/ML (PF) SYRINGE
PREFILLED_SYRINGE | INTRAVENOUS | Status: AC
  Filled 2017-06-26: qty 10

## 2017-06-26 MED ORDER — PHENYLEPHRINE 40 MCG/ML (10ML) SYRINGE FOR IV PUSH (FOR BLOOD PRESSURE SUPPORT)
PREFILLED_SYRINGE | INTRAVENOUS | Status: AC
  Filled 2017-06-26: qty 10

## 2017-06-26 MED ORDER — PANTOPRAZOLE SODIUM 40 MG PO TBEC
40.0000 mg | DELAYED_RELEASE_TABLET | Freq: Every day | ORAL | Status: DC
  Administered 2017-06-27: 40 mg via ORAL

## 2017-06-26 MED ORDER — ASPIRIN EC 81 MG PO TBEC
81.0000 mg | DELAYED_RELEASE_TABLET | Freq: Every day | ORAL | Status: DC
  Administered 2017-06-26 – 2017-06-28 (×2): 81 mg via ORAL
  Filled 2017-06-26 (×2): qty 1

## 2017-06-26 MED ORDER — IBUPROFEN 200 MG PO TABS: 600.0000 mg | ORAL_TABLET | Freq: Three times a day (TID) | ORAL | Status: DC | PRN

## 2017-06-26 MED ORDER — SODIUM CHLORIDE 0.9% FLUSH: 3.0000 mL | INTRAVENOUS | Status: DC | PRN

## 2017-06-26 MED ORDER — SUFENTANIL CITRATE 50 MCG/ML IV SOLN
INTRAVENOUS | Status: DC | PRN
  Administered 2017-06-26: 5 ug via INTRAVENOUS
  Administered 2017-06-26: 10 ug via INTRAVENOUS
  Administered 2017-06-26: 15 ug via INTRAVENOUS

## 2017-06-26 MED ORDER — LACTATED RINGERS IV SOLN
INTRAVENOUS | Status: DC | PRN
  Administered 2017-06-26: 07:00:00 via INTRAVENOUS

## 2017-06-26 MED ORDER — HYDROMORPHONE HCL 2 MG/ML IJ SOLN: 0.2500 mg | INTRAMUSCULAR | Status: DC | PRN

## 2017-06-26 MED ORDER — HYDROMORPHONE HCL 1 MG/ML IJ SOLN: 0.5000 mg | INTRAMUSCULAR | Status: DC | PRN

## 2017-06-26 MED ORDER — PHENOL 1.4 % MT LIQD: 1.0000 | OROMUCOSAL | Status: DC | PRN

## 2017-06-26 MED ORDER — BACITRACIN 50000 UNITS IM SOLR
INTRAMUSCULAR | Status: DC | PRN
  Administered 2017-06-26: 08:00:00

## 2017-06-26 MED ORDER — LIDOCAINE-EPINEPHRINE 1 %-1:100000 IJ SOLN
INTRAMUSCULAR | Status: DC | PRN
  Administered 2017-06-26: 6 mL

## 2017-06-26 MED ORDER — ROCURONIUM BROMIDE 100 MG/10ML IV SOLN
INTRAVENOUS | Status: DC | PRN
  Administered 2017-06-26: 50 mg via INTRAVENOUS
  Administered 2017-06-26: 10 mg via INTRAVENOUS

## 2017-06-26 MED ORDER — OXYCODONE HCL 5 MG/5ML PO SOLN: 5.0000 mg | Freq: Once | ORAL | Status: DC | PRN

## 2017-06-26 MED ORDER — THROMBIN 5000 UNITS EX SOLR
CUTANEOUS | Status: AC
  Filled 2017-06-26: qty 15000

## 2017-06-26 MED ORDER — CYCLOBENZAPRINE HCL 10 MG PO TABS
ORAL_TABLET | ORAL | Status: AC
  Filled 2017-06-26: qty 1

## 2017-06-26 MED ORDER — GLIMEPIRIDE 4 MG PO TABS
4.0000 mg | ORAL_TABLET | Freq: Every day | ORAL | Status: DC
  Administered 2017-06-27 – 2017-06-28 (×2): 4 mg via ORAL
  Filled 2017-06-26 (×2): qty 1
  Filled 2017-06-26: qty 2

## 2017-06-26 MED ORDER — ONDANSETRON HCL 4 MG/2ML IJ SOLN
INTRAMUSCULAR | Status: DC | PRN
  Administered 2017-06-26: 4 mg via INTRAVENOUS

## 2017-06-26 MED ORDER — DEXAMETHASONE SODIUM PHOSPHATE 10 MG/ML IJ SOLN
INTRAMUSCULAR | Status: AC
  Filled 2017-06-26: qty 1

## 2017-06-26 MED ORDER — LIDOCAINE HCL (CARDIAC) PF 100 MG/5ML IV SOSY
PREFILLED_SYRINGE | INTRAVENOUS | Status: DC | PRN
  Administered 2017-06-26: 60 mg via INTRAVENOUS

## 2017-06-26 MED ORDER — LOSARTAN POTASSIUM-HCTZ 100-12.5 MG PO TABS: 1.0000 | ORAL_TABLET | Freq: Every day | ORAL | Status: DC

## 2017-06-26 MED ORDER — SODIUM CHLORIDE 0.9 % IJ SOLN
INTRAMUSCULAR | Status: AC
  Filled 2017-06-26: qty 10

## 2017-06-26 MED ORDER — ONDANSETRON HCL 4 MG/2ML IJ SOLN: 4.0000 mg | Freq: Four times a day (QID) | INTRAMUSCULAR | Status: DC | PRN

## 2017-06-26 MED ORDER — CYCLOBENZAPRINE HCL 10 MG PO TABS
10.0000 mg | ORAL_TABLET | Freq: Three times a day (TID) | ORAL | Status: DC | PRN
  Administered 2017-06-26 – 2017-06-28 (×5): 10 mg via ORAL
  Filled 2017-06-26 (×4): qty 1

## 2017-06-26 MED ORDER — PROPOFOL 10 MG/ML IV BOLUS
INTRAVENOUS | Status: AC
  Filled 2017-06-26: qty 20

## 2017-06-26 MED ORDER — MENTHOL 3 MG MT LOZG: 1.0000 | LOZENGE | OROMUCOSAL | Status: DC | PRN

## 2017-06-26 MED ORDER — ONDANSETRON HCL 4 MG PO TABS: 4.0000 mg | ORAL_TABLET | Freq: Four times a day (QID) | ORAL | Status: DC | PRN

## 2017-06-26 MED ORDER — OXYCODONE HCL 5 MG PO TABS
ORAL_TABLET | ORAL | Status: AC
  Administered 2017-06-26: 10 mg via ORAL
  Filled 2017-06-26: qty 2

## 2017-06-26 MED ORDER — OXYCODONE HCL 5 MG PO TABS: 5.0000 mg | ORAL_TABLET | Freq: Once | ORAL | Status: DC | PRN

## 2017-06-26 MED ORDER — METFORMIN HCL 500 MG PO TABS
1000.0000 mg | ORAL_TABLET | Freq: Two times a day (BID) | ORAL | Status: DC
  Administered 2017-06-26 – 2017-06-28 (×5): 1000 mg via ORAL
  Filled 2017-06-26 (×5): qty 2

## 2017-06-26 MED ORDER — PANTOPRAZOLE SODIUM 40 MG PO TBEC
40.0000 mg | DELAYED_RELEASE_TABLET | Freq: Every day | ORAL | Status: DC
  Administered 2017-06-28: 40 mg via ORAL
  Filled 2017-06-26 (×3): qty 1

## 2017-06-26 MED ORDER — LIDOCAINE 2% (20 MG/ML) 5 ML SYRINGE
INTRAMUSCULAR | Status: AC
  Filled 2017-06-26: qty 5

## 2017-06-26 MED ORDER — HYDROCHLOROTHIAZIDE 12.5 MG PO CAPS
12.5000 mg | ORAL_CAPSULE | Freq: Every day | ORAL | Status: DC
  Administered 2017-06-26 – 2017-06-28 (×3): 12.5 mg via ORAL
  Filled 2017-06-26 (×3): qty 1

## 2017-06-26 MED ORDER — LOSARTAN POTASSIUM 50 MG PO TABS
100.0000 mg | ORAL_TABLET | Freq: Every day | ORAL | Status: DC
  Administered 2017-06-26 – 2017-06-28 (×3): 100 mg via ORAL
  Filled 2017-06-26 (×4): qty 2

## 2017-06-26 MED ORDER — LINAGLIPTIN 5 MG PO TABS
5.0000 mg | ORAL_TABLET | Freq: Every day | ORAL | Status: DC
  Administered 2017-06-26 – 2017-06-28 (×3): 5 mg via ORAL
  Filled 2017-06-26 (×3): qty 1

## 2017-06-26 MED ORDER — DEXAMETHASONE SODIUM PHOSPHATE 10 MG/ML IJ SOLN
INTRAMUSCULAR | Status: DC | PRN
  Administered 2017-06-26: 5 mg via INTRAVENOUS

## 2017-06-26 MED ORDER — CEFAZOLIN SODIUM-DEXTROSE 2-4 GM/100ML-% IV SOLN
2.0000 g | Freq: Three times a day (TID) | INTRAVENOUS | Status: DC
  Administered 2017-06-26 – 2017-06-27 (×3): 2 g via INTRAVENOUS
  Filled 2017-06-26 (×4): qty 100

## 2017-06-26 MED ORDER — ADULT MULTIVITAMIN W/MINERALS CH
1.0000 | ORAL_TABLET | Freq: Every day | ORAL | Status: DC
  Administered 2017-06-26 – 2017-06-28 (×3): 1 via ORAL
  Filled 2017-06-26 (×3): qty 1

## 2017-06-26 MED ORDER — MIDAZOLAM HCL 2 MG/2ML IJ SOLN
INTRAMUSCULAR | Status: AC
  Filled 2017-06-26: qty 2

## 2017-06-26 MED ORDER — ATORVASTATIN CALCIUM 10 MG PO TABS
20.0000 mg | ORAL_TABLET | Freq: Every evening | ORAL | Status: DC
  Administered 2017-06-26 – 2017-06-27 (×2): 20 mg via ORAL
  Filled 2017-06-26: qty 2
  Filled 2017-06-26: qty 1

## 2017-06-26 MED ORDER — ONDANSETRON HCL 4 MG/2ML IJ SOLN
INTRAMUSCULAR | Status: AC
  Filled 2017-06-26: qty 2

## 2017-06-26 MED ORDER — PROPOFOL 10 MG/ML IV BOLUS
INTRAVENOUS | Status: DC | PRN
  Administered 2017-06-26: 100 mg via INTRAVENOUS

## 2017-06-26 MED ORDER — MIDAZOLAM HCL 5 MG/5ML IJ SOLN
INTRAMUSCULAR | Status: DC | PRN
  Administered 2017-06-26: 2 mg via INTRAVENOUS

## 2017-06-26 MED ORDER — PHENYLEPHRINE HCL 10 MG/ML IJ SOLN
INTRAVENOUS | Status: DC | PRN
  Administered 2017-06-26: 40 ug/min via INTRAVENOUS

## 2017-06-26 MED ORDER — LIDOCAINE-EPINEPHRINE 1 %-1:100000 IJ SOLN
INTRAMUSCULAR | Status: AC
  Filled 2017-06-26: qty 1

## 2017-06-26 MED ORDER — SUFENTANIL CITRATE 50 MCG/ML IV SOLN
INTRAVENOUS | Status: AC
  Filled 2017-06-26: qty 1

## 2017-06-26 MED ORDER — BUPIVACAINE HCL (PF) 0.25 % IJ SOLN
INTRAMUSCULAR | Status: DC | PRN
  Administered 2017-06-26: 10 mL

## 2017-06-26 MED ORDER — DEXTROSE 5 % IV SOLN
3.0000 g | INTRAVENOUS | Status: AC
  Administered 2017-06-26: 3 g via INTRAVENOUS
  Filled 2017-06-26: qty 3

## 2017-06-26 MED ORDER — THROMBIN 5000 UNITS EX SOLR
CUTANEOUS | Status: DC | PRN
  Administered 2017-06-26: 10000 [IU] via TOPICAL

## 2017-06-26 MED ORDER — BUPIVACAINE HCL (PF) 0.25 % IJ SOLN
INTRAMUSCULAR | Status: AC
  Filled 2017-06-26: qty 30

## 2017-06-26 MED ORDER — SUGAMMADEX SODIUM 200 MG/2ML IV SOLN
INTRAVENOUS | Status: DC | PRN
  Administered 2017-06-26: 200 mg via INTRAVENOUS

## 2017-06-26 MED ORDER — VITAMIN D 1000 UNITS PO TABS
2000.0000 [IU] | ORAL_TABLET | Freq: Every day | ORAL | Status: DC
  Administered 2017-06-26 – 2017-06-28 (×3): 2000 [IU] via ORAL
  Filled 2017-06-26 (×5): qty 2

## 2017-06-26 MED ORDER — ACETAMINOPHEN 650 MG RE SUPP: 650.0000 mg | RECTAL | Status: DC | PRN

## 2017-06-26 MED ORDER — SODIUM CHLORIDE 0.9% FLUSH
3.0000 mL | Freq: Two times a day (BID) | INTRAVENOUS | Status: DC
  Administered 2017-06-26: 3 mL via INTRAVENOUS

## 2017-06-26 MED ORDER — ALUM & MAG HYDROXIDE-SIMETH 200-200-20 MG/5ML PO SUSP: 30.0000 mL | Freq: Four times a day (QID) | ORAL | Status: DC | PRN

## 2017-06-26 MED ORDER — ACETAMINOPHEN 325 MG PO TABS: 650.0000 mg | ORAL_TABLET | ORAL | Status: DC | PRN

## 2017-06-26 MED ORDER — OXYCODONE HCL 5 MG PO TABS
10.0000 mg | ORAL_TABLET | ORAL | Status: DC | PRN
  Administered 2017-06-26 – 2017-06-28 (×12): 10 mg via ORAL
  Filled 2017-06-26 (×11): qty 2

## 2017-06-26 MED ORDER — SUGAMMADEX SODIUM 200 MG/2ML IV SOLN
INTRAVENOUS | Status: AC
  Filled 2017-06-26: qty 2

## 2017-06-26 SURGICAL SUPPLY — 59 items
BAG DECANTER FOR FLEXI CONT (MISCELLANEOUS) ×3 IMPLANT
BENZOIN TINCTURE PRP APPL 2/3 (GAUZE/BANDAGES/DRESSINGS) ×3 IMPLANT
BLADE CLIPPER SURG (BLADE) IMPLANT
BLADE SURG 11 STRL SS (BLADE) ×3 IMPLANT
BUR CUTTER 7.0 ROUND (BURR) ×3 IMPLANT
BUR MATCHSTICK NEURO 3.0 LAGG (BURR) ×3 IMPLANT
CANISTER SUCT 3000ML PPV (MISCELLANEOUS) ×3 IMPLANT
CARTRIDGE OIL MAESTRO DRILL (MISCELLANEOUS) ×1 IMPLANT
CLOSURE WOUND 1/2 X4 (GAUZE/BANDAGES/DRESSINGS) ×1
DECANTER SPIKE VIAL GLASS SM (MISCELLANEOUS) ×3 IMPLANT
DERMABOND ADVANCED (GAUZE/BANDAGES/DRESSINGS) ×2
DERMABOND ADVANCED .7 DNX12 (GAUZE/BANDAGES/DRESSINGS) ×1 IMPLANT
DIFFUSER DRILL AIR PNEUMATIC (MISCELLANEOUS) ×3 IMPLANT
DRAPE HALF SHEET 40X57 (DRAPES) ×3 IMPLANT
DRAPE LAPAROTOMY 100X72X124 (DRAPES) ×3 IMPLANT
DRAPE MICROSCOPE LEICA (MISCELLANEOUS) ×3 IMPLANT
DRAPE SURG 17X23 STRL (DRAPES) ×3 IMPLANT
DRSG OPSITE 4X5.5 SM (GAUZE/BANDAGES/DRESSINGS) ×3 IMPLANT
DRSG OPSITE POSTOP 4X6 (GAUZE/BANDAGES/DRESSINGS) ×3 IMPLANT
DURAPREP 26ML APPLICATOR (WOUND CARE) ×3 IMPLANT
ELECT BLADE 4.0 EZ CLEAN MEGAD (MISCELLANEOUS) ×3
ELECT REM PT RETURN 9FT ADLT (ELECTROSURGICAL) ×3
ELECTRODE BLDE 4.0 EZ CLN MEGD (MISCELLANEOUS) ×1 IMPLANT
ELECTRODE REM PT RTRN 9FT ADLT (ELECTROSURGICAL) ×1 IMPLANT
EVACUATOR 1/8 PVC DRAIN (DRAIN) ×3 IMPLANT
GAUZE SPONGE 4X4 12PLY STRL (GAUZE/BANDAGES/DRESSINGS) ×3 IMPLANT
GAUZE SPONGE 4X4 16PLY XRAY LF (GAUZE/BANDAGES/DRESSINGS) IMPLANT
GLOVE BIO SURGEON STRL SZ7 (GLOVE) ×12 IMPLANT
GLOVE BIO SURGEON STRL SZ8 (GLOVE) ×3 IMPLANT
GLOVE BIOGEL PI IND STRL 7.0 (GLOVE) ×2 IMPLANT
GLOVE BIOGEL PI INDICATOR 7.0 (GLOVE) ×4
GLOVE ECLIPSE 7.5 STRL STRAW (GLOVE) IMPLANT
GLOVE EXAM NITRILE LRG STRL (GLOVE) IMPLANT
GLOVE EXAM NITRILE XL STR (GLOVE) IMPLANT
GLOVE EXAM NITRILE XS STR PU (GLOVE) IMPLANT
GLOVE INDICATOR 8.5 STRL (GLOVE) ×3 IMPLANT
GOWN STRL REUS W/ TWL LRG LVL3 (GOWN DISPOSABLE) ×2 IMPLANT
GOWN STRL REUS W/ TWL XL LVL3 (GOWN DISPOSABLE) ×2 IMPLANT
GOWN STRL REUS W/TWL 2XL LVL3 (GOWN DISPOSABLE) IMPLANT
GOWN STRL REUS W/TWL LRG LVL3 (GOWN DISPOSABLE) ×4
GOWN STRL REUS W/TWL XL LVL3 (GOWN DISPOSABLE) ×4
KIT BASIN OR (CUSTOM PROCEDURE TRAY) ×3 IMPLANT
KIT TURNOVER KIT B (KITS) ×3 IMPLANT
NEEDLE HYPO 22GX1.5 SAFETY (NEEDLE) ×3 IMPLANT
NEEDLE SPNL 22GX3.5 QUINCKE BK (NEEDLE) ×3 IMPLANT
NS IRRIG 1000ML POUR BTL (IV SOLUTION) ×3 IMPLANT
OIL CARTRIDGE MAESTRO DRILL (MISCELLANEOUS) ×3
PACK LAMINECTOMY NEURO (CUSTOM PROCEDURE TRAY) ×3 IMPLANT
RUBBERBAND STERILE (MISCELLANEOUS) ×6 IMPLANT
SPONGE SURGIFOAM ABS GEL SZ50 (HEMOSTASIS) ×3 IMPLANT
STRIP CLOSURE SKIN 1/2X4 (GAUZE/BANDAGES/DRESSINGS) ×2 IMPLANT
SUT VIC AB 0 CT1 18XCR BRD8 (SUTURE) ×1 IMPLANT
SUT VIC AB 0 CT1 8-18 (SUTURE) ×2
SUT VIC AB 2-0 CT1 18 (SUTURE) ×3 IMPLANT
SUT VIC AB 4-0 PS2 27 (SUTURE) ×3 IMPLANT
TOWEL GREEN STERILE (TOWEL DISPOSABLE) ×3 IMPLANT
TOWEL GREEN STERILE FF (TOWEL DISPOSABLE) ×3 IMPLANT
TRAY FOLEY METER SIL LF 16FR (CATHETERS) ×3 IMPLANT
WATER STERILE IRR 1000ML POUR (IV SOLUTION) ×3 IMPLANT

## 2017-06-26 NOTE — Op Note (Signed)
Preoperative diagnosis: Lumbar spinal stenosis L2-3 L3-4 and L4-5 with bilateral right greater than left L3, L4, and L5 radiculopathies and neurogenic claudication  Postoperative diagnosis: Same  Procedure: Decompressive lumbar laminectomies L2-3, L3-4, and L4-5 with partial medial facetectomies and foraminotomies of the L3, L4, and L5 nerve roots bilaterally  Surgeon: Dominica Severin Tracyann Duffell  Asst.: Glenford Peers  Anesthesia: Gen.  EBL: Minimal  History of present illness: 77 year old gentleman with back pain and right greater left leg pain and neurogenic claudication were very short distances. Workup revealed severe spinal stenosis predominantly at L3-4 and L4-5 extending the inferior aspect of L2-3. Due to patient's progression of clinical syndrome imaging findings and failure conservative treatment I recommended decompressive laminectomy at those 3 levels. I extensively went over the risks and benefits of the operation with the patient as well as perioperative course expectations of outcome and alternatives of surgery and he understood and agreed to proceed forward.  Operative procedure: Patient brought into the or was induced on general anesthesia positioned prone the Wilson frame his back was prepped and draped in routine sterile fashion preoperative x-ray localized the appropriate level so after infiltration of 8 mL lidocaine with epi a midline incision was made and Bovie light cautery was used to take down the subcutaneous tissues and subperiosteal dissections care lamina of L2, L3, L4, and L5. Interoperative x-ray confirmed the location 34 disc space so than the entire spinous process was removed at L3 and L4 superior aspect of L5 and inferior aspect of L2. Then using utilizing a high-speed drill the lamina was drilled down centrally laminotomy was begun centrally there was immediately identified and marked hourglass compression of thecal sac primarily at L3-4 and L4-5 with dense adherence of the dura  primarily at L3-4 on the patient's left side but also at L4-5 bilaterally. So utilizing high-speed drill a 2 mm Kerrison punch I under bit the medial gutter and I removed a large medial projecting spurs identified the under surface of the takeoff of the 3 root perform foraminotomies of the L3 L4 and L5 nerve roots bilaterally removed the large spurs primarily at L3-4 and L4-5 extended up to the inferior aspect of the L2-3 disc space and down to the inferior aspect of the 4-5 disc space. And then decompression was no further stenosis centrally or foraminally wound scope was irrigated meticulous he states was maintained Gelfoam was laid on top of the dura a medium Hemovac drain was placed and the wounds closed in layers with after Vicryl running 4 subcuticular Dermabond benzo and Steri-Strips and a sterile dressing. At the end of case all needle counts sponge counts were correct patient recovered in stable condition.

## 2017-06-26 NOTE — H&P (Signed)
Victor Goodman is an 77 y.o. male.   Chief Complaint: neurogenic claudication HPI: 77 year old gentleman with back pain and neurogenic claudication that's been refractory to conservative treatment over the last several months and years. Workup revealed severe critical spinal stenosis at L3-4 and L4-5 with lobe extending up to L2-3. Due to patient's failure conservative treatment imaging findings and progression of clinical syndrome I recommended decompressive laminectomies at L2-3, L3-4 and L4-5. I've extensively gone over the risks and benefits of the operation with the patient as well as perioperative course expectations of outcome and alternatives of surgery and he understands and agrees to proceed forward.  Past Medical History:  Diagnosis Date  . Arthritis   . Cancer ([place])    basal carcinoma  . Diabetes mellitus without complication ([place])   . Diverticulosis   . Dysphagia   . GERD (gastroesophageal reflux disease)   . Hiatal hernia   . Hx of colonic polyps   . Hyperlipidemia   . Hypertension   . No pertinent past medical history   . Prostate cancer ([place]) 12/02/13   Gleason 8, 11/12 cores positive, volume 76 gms   . Ruptured disk    hx  . Skin cancer 2010, 2014   non melanoma    Past Surgical History:  Procedure Laterality Date  . APPENDECTOMY  1962  . ESOPHAGEAL DILATION    . EYE SURGERY  2009, 2010   cataract  . HERNIA REPAIR  2008   hiatal hernia  . OTHER SURGICAL HISTORY  2011,2010   cataract surgery  . PROSTATE BIOPSY  12/02/13   Gleason 8, 11/12 cores positive, volume 76 gms  . TONSILLECTOMY  1947    Family History  Problem Relation Age of Onset  . Cancer Mother 55       bone  . Alzheimer's disease Father    Social History:  reports that he has never smoked. He has never used smokeless tobacco. He reports that he drinks about 0.6 oz of alcohol per week. He reports that he does not use drugs.  Allergies: No Known Allergies  Medications Prior to Admission   Medication Sig Dispense Refill  . aspirin EC 81 MG tablet Take 81 mg by mouth daily.    Marland Kitchen atorvastatin (LIPITOR) 20 MG tablet Take 20 mg by mouth every evening.     . Cholecalciferol (VITAMIN D3) 2000 UNITS TABS Take 2,000 Units by mouth daily.     Marland Kitchen glimepiride (AMARYL) 2 MG tablet Take 4 mg by mouth daily with breakfast.    . ibuprofen (ADVIL,MOTRIN) 200 MG tablet Take 600 mg by mouth every 8 (eight) hours as needed (for pain.).     Marland Kitchen losartan-hydrochlorothiazide (HYZAAR) 100-12.5 MG per tablet Take 1 tablet by mouth daily.    . metFORMIN (GLUCOPHAGE) 500 MG tablet Take 1,000 mg by mouth 2 (two) times daily.     . Multiple Vitamin (MULTIVITAMIN WITH MINERALS) TABS Take 1 tablet by mouth daily.    . Omega-3 Fatty Acids (FISH OIL) 1000 MG CPDR Take 1,000 mg by mouth daily.     Marland Kitchen omeprazole (PRILOSEC) 40 MG capsule Take 40 mg by mouth daily before breakfast.    . sitaGLIPtin (JANUVIA) 100 MG tablet Take 100 mg by mouth daily.      Results for orders placed or performed during the hospital encounter of 06/26/17 (from the past 48 hour(s))  Glucose, capillary     Status: Abnormal   Collection Time: 06/26/17  5:51 AM  Result Value  Ref Range   Glucose-Capillary 124 (H) 65 - 99 mg/dL   No results found.  Review of Systems  Musculoskeletal: Positive for back pain.  Neurological: Positive for tingling and sensory change.    Blood pressure (!) 167/79, pulse 80, temperature 98 F (36.7 C), temperature source Oral, resp. rate 20, height 5' 10.5" (1.791 m), weight 118.5 kg (261 lb 4.8 oz), SpO2 97 %. Physical Exam  Constitutional: He is oriented to person, place, and time. He appears well-developed and well-nourished.  HENT:  Head: Normocephalic.  Eyes: Pupils are equal, round, and reactive to light.  Neck: Normal range of motion.  Respiratory: Effort normal.  GI: Soft. Bowel sounds are normal.  Neurological: He is alert and oriented to person, place, and time. He has normal strength. GCS  eye subscore is 4. GCS verbal subscore is 5. GCS motor subscore is 6.  Patient is awake and alert strength is 5 out of 5 in his lower extremities iliopsoas, quads, hamstrings, gastric, and tibialis, and EHL.  Skin: Skin is warm and dry.     Assessment/Plan 77 year old gentleman presents for decompressive lumbar laminectomies L2-3 3445.  Brittanni Cariker P, MD 06/26/2017, 7:19 AM

## 2017-06-26 NOTE — Anesthesia Preprocedure Evaluation (Signed)
Anesthesia Evaluation  Patient identified by MRN, date of birth, ID band Patient awake    Reviewed: Allergy & Precautions, NPO status , Patient's Chart, lab work & pertinent test results  Airway Mallampati: II   Neck ROM: Full    Dental  (+) Teeth Intact   Pulmonary neg pulmonary ROS,    breath sounds clear to auscultation       Cardiovascular hypertension,  Rhythm:Regular Rate:Normal     Neuro/Psych negative neurological ROS     GI/Hepatic hiatal hernia, GERD  ,  Endo/Other  diabetes, Well ControlledMorbid obesity  Renal/GU      Musculoskeletal  (+) Arthritis ,   Abdominal (+) + obese,   Peds  Hematology   Anesthesia Other Findings   Reproductive/Obstetrics                             Anesthesia Physical Anesthesia Plan  ASA: III  Anesthesia Plan: General   Post-op Pain Management:    Induction: Intravenous  PONV Risk Score and Plan: 3 and Treatment may vary due to age or medical condition, Ondansetron and Dexamethasone  Airway Management Planned: Oral ETT  Additional Equipment:   Intra-op Plan:   Post-operative Plan: Extubation in OR  Informed Consent: I have reviewed the patients History and Physical, chart, labs and discussed the procedure including the risks, benefits and alternatives for the proposed anesthesia with the patient or authorized representative who has indicated his/her understanding and acceptance.   Dental advisory given  Plan Discussed with: CRNA  Anesthesia Plan Comments:         Anesthesia Quick Evaluation

## 2017-06-26 NOTE — Anesthesia Procedure Notes (Signed)
Procedure Name: Intubation Date/Time: 06/26/2017 7:37 AM Performed by: Izora Gala, CRNA Pre-anesthesia Checklist: Patient identified, Emergency Drugs available, Suction available and Patient being monitored Patient Re-evaluated:Patient Re-evaluated prior to induction Oxygen Delivery Method: Circle system utilized Preoxygenation: Pre-oxygenation with 100% oxygen Induction Type: IV induction Ventilation: Mask ventilation without difficulty and Oral airway inserted - appropriate to patient size Laryngoscope Size: Miller and 3 Grade View: Grade III Tube type: Oral Tube size: 7.5 mm Number of attempts: 2 Placement Confirmation: ETT inserted through vocal cords under direct vision,  positive ETCO2 and breath sounds checked- equal and bilateral Secured at: 22 cm Tube secured with: Tape Dental Injury: Teeth and Oropharynx as per pre-operative assessment

## 2017-06-26 NOTE — Transfer of Care (Signed)
Immediate Anesthesia Transfer of Care Note  Patient: Victor Goodman  Procedure(s) Performed: Laminectomy and Foraminotomy - Lumbar Two-Lumbar Three - Lumbar Three-Lumbar Four - Lumbar Four-Lumbar Five (N/A Back)  Patient Location: PACU  Anesthesia Type:General  Level of Consciousness: awake, drowsy and patient cooperative  Airway & Oxygen Therapy: Patient Spontanous Breathing and Patient connected to nasal cannula oxygen  Post-op Assessment: Report given to RN, Post -op Vital signs reviewed and stable, Patient moving all extremities and Patient moving all extremities X 4  Post vital signs: Reviewed and stable  Last Vitals:  Vitals Value Taken Time  BP 130/83 06/26/2017 10:25 AM  Temp    Pulse 73 06/26/2017 10:27 AM  Resp 14 06/26/2017 10:27 AM  SpO2 97 % 06/26/2017 10:27 AM  Vitals shown include unvalidated device data.  Last Pain:  Vitals:   06/26/17 0627  TempSrc:   PainSc: 2       Patients Stated Pain Goal: 6 (16/10/96 0454)  Complications: No apparent anesthesia complications

## 2017-06-26 NOTE — Anesthesia Postprocedure Evaluation (Signed)
Anesthesia Post Note  Patient: Victor Goodman  Procedure(s) Performed: Laminectomy and Foraminotomy - Lumbar Two-Lumbar Three - Lumbar Three-Lumbar Four - Lumbar Four-Lumbar Five (N/A Back)     Patient location during evaluation: PACU Anesthesia Type: General Level of consciousness: awake, sedated and oriented Pain management: pain level controlled Vital Signs Assessment: post-procedure vital signs reviewed and stable Respiratory status: spontaneous breathing, nonlabored ventilation, respiratory function stable and patient connected to nasal cannula oxygen Cardiovascular status: blood pressure returned to baseline and stable Postop Assessment: no apparent nausea or vomiting Anesthetic complications: no    Last Vitals:  Vitals:   06/26/17 1125 06/26/17 1158  BP: 133/69 (!) 146/77  Pulse: 72 79  Resp: 18 18  Temp:    SpO2: 94% 95%    Last Pain:  Vitals:   06/26/17 1200  TempSrc:   PainSc: 2                  Jorge Amparo,JAMES TERRILL

## 2017-06-27 ENCOUNTER — Encounter (HOSPITAL_COMMUNITY): Payer: Self-pay | Admitting: Neurosurgery

## 2017-06-27 LAB — GLUCOSE, CAPILLARY
GLUCOSE-CAPILLARY: 152 mg/dL — AB (ref 65–99)
GLUCOSE-CAPILLARY: 154 mg/dL — AB (ref 65–99)

## 2017-06-27 NOTE — Progress Notes (Signed)
Report called to Levada Dy, RN on 3W. Pt transferred to 3W10 per MD order. Pt is stable @ transfer and has no needs at this time. Holli Humbles, RN

## 2017-06-27 NOTE — Progress Notes (Signed)
Patient ID: Victor Goodman, male   DOB: 25-Apr-1940, 77 y.o.   MRN: [id number] Vital signs are stable Motor function appears good Patient notes that legs feel better Incision is clean and dry however drain has over 400 cc of output since yesterday We will transfer to 3 W. and monitor drainage today Encourage ambulation If drain can come out tomorrow then patient can be discharged home

## 2017-06-28 MED ORDER — CYCLOBENZAPRINE HCL 10 MG PO TABS: 10.0000 mg | ORAL_TABLET | Freq: Three times a day (TID) | ORAL | 1 refills | Status: AC | PRN

## 2017-06-28 MED ORDER — OXYCODONE HCL 10 MG PO TABS: 5.0000 mg | ORAL_TABLET | ORAL | 0 refills | Status: AC | PRN

## 2017-06-28 NOTE — Progress Notes (Signed)
Patient ready for discharge to home; discharge instructions given and reviewed; Rx's given; patient discharged out via wheelchair; accompanied home by his spouse.

## 2017-06-28 NOTE — Progress Notes (Signed)
Subjective: Patient reports doing well  Objective: Vital signs in last 24 hours: Temp:  [98.6 F (37 C)-100.3 F (37.9 C)] 98.9 F (37.2 C) (06/16 0746) Pulse Rate:  [63-84] 63 (06/16 0746) Resp:  [18-21] 18 (06/16 0746) BP: (116-147)/(52-71) 147/52 (06/16 0746) SpO2:  [90 %-93 %] 93 % (06/16 0746)  Intake/Output from previous day: 06/15 0701 - 06/16 0700 In: -  Out: 185 [Drains:185] Intake/Output this shift: No intake/output data recorded.  Physical Exam: Full strength both legs.  Dressing CDI.  Lab Results: No results for input(s): WBC, HGB, HCT, PLT in the last 72 hours. BMET No results for input(s): NA, K, CL, CO2, GLUCOSE, BUN, CREATININE, CALCIUM in the last 72 hours.  Studies/Results: No results found.  Assessment/Plan: Patient is doing well.  Will discontinue drain and discharge home.    LOS: 2 days    Peggyann Shoals, MD 06/28/2017, 11:08 AM

## 2017-06-28 NOTE — Progress Notes (Signed)
Subjective: Patient reports doing well  Objective: Vital signs in last 24 hours: Temp:  [98.6 F (37 C)-100.3 F (37.9 C)] 98.9 F (37.2 C) (06/16 0746) Pulse Rate:  [63-84] 63 (06/16 0746) Resp:  [18-21] 18 (06/16 0746) BP: (116-147)/(52-71) 147/52 (06/16 0746) SpO2:  [90 %-93 %] 93 % (06/16 0746)  Intake/Output from previous day: 06/15 0701 - 06/16 0700 In: -  Out: 185 [Drains:185] Intake/Output this shift: No intake/output data recorded.  Physical Exam: Strength full.   Lab Results: No results for input(s): WBC, HGB, HCT, PLT in the last 72 hours. BMET No results for input(s): NA, K, CL, CO2, GLUCOSE, BUN, CREATININE, CALCIUM in the last 72 hours.  Studies/Results: No results found.  Assessment/Plan: Patient is doing well.  Discontinue drain and discharge home.    LOS: 2 days    Peggyann Shoals, MD 06/28/2017, 11:04 AM

## 2017-06-28 NOTE — Discharge Summary (Signed)
Physician Discharge Summary  Patient ID: Victor Goodman MRN: [id number] DOB/AGE: [birth year] 75 y.o.  Admit date: 06/26/2017 Discharge date: 06/28/2017  Admission Diagnoses:Lumbar spinal stenosis with neurogenic claudication  Discharge Diagnoses: Same Active Problems:   Spinal stenosis at L4-L5 level   Discharged Condition: good  Hospital Course: Patient underwent decompressive lumbar laminectomy for stenosis L 2 - L 5 levels.  He did well with surgery.  Postoperatively, a drain placed in the wound bed had significant output, so we elected to keep patient in hospital an additional day.  On POD 2, drain output was diminished and he was doing well, so we discharged him home.  Consults: None  Significant Diagnostic Studies: None  Treatments: surgery: Patient underwent decompressive lumbar laminectomy for stenosis L 2 - L 5 levels  Discharge Exam: Blood pressure (!) 147/52, pulse 63, temperature 98.9 F (37.2 C), temperature source Oral, resp. rate 18, height 5' 10.5" (1.791 m), weight 118.5 kg (261 lb 4.8 oz), SpO2 93 %. Neurologic: Alert and oriented X 3, normal strength and tone. Normal symmetric reflexes. Normal coordination and gait Wound:CDI  Disposition: Home  Discharge Instructions    Diet - low sodium heart healthy   Complete by:  As directed    Increase activity slowly   Complete by:  As directed      Allergies as of 06/28/2017   No Known Allergies     Medication List    TAKE these medications   AMARYL 2 MG tablet Generic drug:  glimepiride Take 4 mg by mouth daily with breakfast.   aspirin EC 81 MG tablet Take 81 mg by mouth daily.   atorvastatin 20 MG tablet Commonly known as:  LIPITOR Take 20 mg by mouth every evening.   cyclobenzaprine 10 MG tablet Commonly known as:  FLEXERIL Take 1 tablet (10 mg total) by mouth 3 (three) times daily as needed for muscle spasms.   Fish Oil 1000 MG Cpdr Take 1,000 mg by mouth daily.   ibuprofen 200 MG  tablet Commonly known as:  ADVIL,MOTRIN Take 600 mg by mouth every 8 (eight) hours as needed (for pain.).   losartan-hydrochlorothiazide 100-12.5 MG tablet Commonly known as:  HYZAAR Take 1 tablet by mouth daily.   metFORMIN 500 MG tablet Commonly known as:  GLUCOPHAGE Take 1,000 mg by mouth 2 (two) times daily.   multivitamin with minerals Tabs tablet Take 1 tablet by mouth daily.   omeprazole 40 MG capsule Commonly known as:  PRILOSEC Take 40 mg by mouth daily before breakfast.   Oxycodone HCl 10 MG Tabs Take 0.5-1 tablets (5-10 mg total) by mouth every 4 (four) hours as needed for moderate pain or severe pain ((score 7 to 10)).   sitaGLIPtin 100 MG tablet Commonly known as:  JANUVIA Take 100 mg by mouth daily.   Vitamin D3 2000 units Tabs Take 2,000 Units by mouth daily.        Signed: Peggyann Shoals, MD 06/28/2017, 11:09 AM

## 2017-06-28 NOTE — Progress Notes (Signed)
Orders to remove hemovac drain from lumbar spine; using sterile technique removed suture; then removed lumbar drain; sterile gauze applied and secured with tape. Incision care reviewed with patient and his wife.

## 2017-07-27 DIAGNOSIS — E1121 Type 2 diabetes mellitus with diabetic nephropathy: Secondary | ICD-10-CM | POA: Diagnosis not present

## 2017-08-21 DIAGNOSIS — M48062 Spinal stenosis, lumbar region with neurogenic claudication: Secondary | ICD-10-CM | POA: Diagnosis not present

## 2017-09-04 DIAGNOSIS — M48062 Spinal stenosis, lumbar region with neurogenic claudication: Secondary | ICD-10-CM | POA: Diagnosis not present

## 2017-09-11 DIAGNOSIS — M48062 Spinal stenosis, lumbar region with neurogenic claudication: Secondary | ICD-10-CM | POA: Diagnosis not present

## 2017-10-27 DIAGNOSIS — E119 Type 2 diabetes mellitus without complications: Secondary | ICD-10-CM | POA: Diagnosis not present

## 2017-11-20 DIAGNOSIS — Z23 Encounter for immunization: Secondary | ICD-10-CM | POA: Diagnosis not present

## 2018-01-29 DIAGNOSIS — C61 Malignant neoplasm of prostate: Secondary | ICD-10-CM | POA: Diagnosis not present

## 2018-02-04 DIAGNOSIS — C61 Malignant neoplasm of prostate: Secondary | ICD-10-CM | POA: Diagnosis not present

## 2018-02-04 DIAGNOSIS — N3941 Urge incontinence: Secondary | ICD-10-CM | POA: Diagnosis not present

## 2018-02-04 DIAGNOSIS — N3281 Overactive bladder: Secondary | ICD-10-CM | POA: Diagnosis not present

## 2018-02-16 DIAGNOSIS — Z1389 Encounter for screening for other disorder: Secondary | ICD-10-CM | POA: Diagnosis not present

## 2018-02-16 DIAGNOSIS — K219 Gastro-esophageal reflux disease without esophagitis: Secondary | ICD-10-CM | POA: Diagnosis not present

## 2018-02-16 DIAGNOSIS — Z0000 Encounter for general adult medical examination without abnormal findings: Secondary | ICD-10-CM | POA: Diagnosis not present

## 2018-02-16 DIAGNOSIS — E7800 Pure hypercholesterolemia, unspecified: Secondary | ICD-10-CM | POA: Diagnosis not present

## 2018-02-16 DIAGNOSIS — C61 Malignant neoplasm of prostate: Secondary | ICD-10-CM | POA: Diagnosis not present

## 2018-02-16 DIAGNOSIS — I129 Hypertensive chronic kidney disease with stage 1 through stage 4 chronic kidney disease, or unspecified chronic kidney disease: Secondary | ICD-10-CM | POA: Diagnosis not present

## 2018-02-16 DIAGNOSIS — N182 Chronic kidney disease, stage 2 (mild): Secondary | ICD-10-CM | POA: Diagnosis not present

## 2018-02-16 DIAGNOSIS — E1121 Type 2 diabetes mellitus with diabetic nephropathy: Secondary | ICD-10-CM | POA: Diagnosis not present

## 2018-03-30 DIAGNOSIS — L570 Actinic keratosis: Secondary | ICD-10-CM | POA: Diagnosis not present

## 2018-03-30 DIAGNOSIS — D485 Neoplasm of uncertain behavior of skin: Secondary | ICD-10-CM | POA: Diagnosis not present

## 2018-03-30 DIAGNOSIS — C44319 Basal cell carcinoma of skin of other parts of face: Secondary | ICD-10-CM | POA: Diagnosis not present

## 2018-04-06 DIAGNOSIS — Z85828 Personal history of other malignant neoplasm of skin: Secondary | ICD-10-CM | POA: Diagnosis not present

## 2018-04-06 DIAGNOSIS — C44319 Basal cell carcinoma of skin of other parts of face: Secondary | ICD-10-CM | POA: Diagnosis not present

## 2018-07-20 DIAGNOSIS — K219 Gastro-esophageal reflux disease without esophagitis: Secondary | ICD-10-CM | POA: Diagnosis not present

## 2018-07-20 DIAGNOSIS — Z86010 Personal history of colonic polyps: Secondary | ICD-10-CM | POA: Diagnosis not present

## 2018-07-20 DIAGNOSIS — R1310 Dysphagia, unspecified: Secondary | ICD-10-CM | POA: Diagnosis not present

## 2018-07-26 DIAGNOSIS — E538 Deficiency of other specified B group vitamins: Secondary | ICD-10-CM | POA: Diagnosis not present

## 2018-07-26 DIAGNOSIS — C61 Malignant neoplasm of prostate: Secondary | ICD-10-CM | POA: Diagnosis not present

## 2018-07-26 DIAGNOSIS — N183 Chronic kidney disease, stage 3 (moderate): Secondary | ICD-10-CM | POA: Diagnosis not present

## 2018-07-26 DIAGNOSIS — K219 Gastro-esophageal reflux disease without esophagitis: Secondary | ICD-10-CM | POA: Diagnosis not present

## 2018-08-03 DIAGNOSIS — C61 Malignant neoplasm of prostate: Secondary | ICD-10-CM | POA: Diagnosis not present

## 2018-08-09 DIAGNOSIS — C61 Malignant neoplasm of prostate: Secondary | ICD-10-CM | POA: Diagnosis not present

## 2018-08-09 DIAGNOSIS — C775 Secondary and unspecified malignant neoplasm of intrapelvic lymph nodes: Secondary | ICD-10-CM | POA: Diagnosis not present

## 2018-08-26 DIAGNOSIS — E7800 Pure hypercholesterolemia, unspecified: Secondary | ICD-10-CM | POA: Diagnosis not present

## 2018-08-26 DIAGNOSIS — N182 Chronic kidney disease, stage 2 (mild): Secondary | ICD-10-CM | POA: Diagnosis not present

## 2018-08-26 DIAGNOSIS — C61 Malignant neoplasm of prostate: Secondary | ICD-10-CM | POA: Diagnosis not present

## 2018-08-26 DIAGNOSIS — E1121 Type 2 diabetes mellitus with diabetic nephropathy: Secondary | ICD-10-CM | POA: Diagnosis not present

## 2018-08-26 DIAGNOSIS — I129 Hypertensive chronic kidney disease with stage 1 through stage 4 chronic kidney disease, or unspecified chronic kidney disease: Secondary | ICD-10-CM | POA: Diagnosis not present

## 2018-08-31 DIAGNOSIS — E1121 Type 2 diabetes mellitus with diabetic nephropathy: Secondary | ICD-10-CM | POA: Diagnosis not present

## 2018-10-14 DIAGNOSIS — L570 Actinic keratosis: Secondary | ICD-10-CM | POA: Diagnosis not present

## 2018-10-14 DIAGNOSIS — L821 Other seborrheic keratosis: Secondary | ICD-10-CM | POA: Diagnosis not present

## 2018-10-14 DIAGNOSIS — L0102 Bockhart's impetigo: Secondary | ICD-10-CM | POA: Diagnosis not present

## 2018-10-14 DIAGNOSIS — D485 Neoplasm of uncertain behavior of skin: Secondary | ICD-10-CM | POA: Diagnosis not present

## 2018-10-14 DIAGNOSIS — L820 Inflamed seborrheic keratosis: Secondary | ICD-10-CM | POA: Diagnosis not present

## 2018-10-14 DIAGNOSIS — Z85828 Personal history of other malignant neoplasm of skin: Secondary | ICD-10-CM | POA: Diagnosis not present

## 2018-10-14 DIAGNOSIS — Z85820 Personal history of malignant melanoma of skin: Secondary | ICD-10-CM | POA: Diagnosis not present

## 2018-11-02 DIAGNOSIS — C61 Malignant neoplasm of prostate: Secondary | ICD-10-CM | POA: Diagnosis not present

## 2018-11-09 DIAGNOSIS — C61 Malignant neoplasm of prostate: Secondary | ICD-10-CM | POA: Diagnosis not present

## 2018-11-09 DIAGNOSIS — N5201 Erectile dysfunction due to arterial insufficiency: Secondary | ICD-10-CM | POA: Diagnosis not present

## 2018-11-19 DIAGNOSIS — Z23 Encounter for immunization: Secondary | ICD-10-CM | POA: Diagnosis not present

## 2019-03-14 DIAGNOSIS — E119 Type 2 diabetes mellitus without complications: Secondary | ICD-10-CM | POA: Diagnosis not present

## 2019-04-21 DIAGNOSIS — Z1389 Encounter for screening for other disorder: Secondary | ICD-10-CM | POA: Diagnosis not present

## 2019-04-21 DIAGNOSIS — C61 Malignant neoplasm of prostate: Secondary | ICD-10-CM | POA: Diagnosis not present

## 2019-04-21 DIAGNOSIS — Z0000 Encounter for general adult medical examination without abnormal findings: Secondary | ICD-10-CM | POA: Diagnosis not present

## 2019-04-21 DIAGNOSIS — Z23 Encounter for immunization: Secondary | ICD-10-CM | POA: Diagnosis not present

## 2019-04-21 DIAGNOSIS — N182 Chronic kidney disease, stage 2 (mild): Secondary | ICD-10-CM | POA: Diagnosis not present

## 2019-04-21 DIAGNOSIS — I129 Hypertensive chronic kidney disease with stage 1 through stage 4 chronic kidney disease, or unspecified chronic kidney disease: Secondary | ICD-10-CM | POA: Diagnosis not present

## 2019-04-21 DIAGNOSIS — Z86010 Personal history of colonic polyps: Secondary | ICD-10-CM | POA: Diagnosis not present

## 2019-04-21 DIAGNOSIS — S81819A Laceration without foreign body, unspecified lower leg, initial encounter: Secondary | ICD-10-CM | POA: Diagnosis not present

## 2019-04-21 DIAGNOSIS — E7800 Pure hypercholesterolemia, unspecified: Secondary | ICD-10-CM | POA: Diagnosis not present

## 2019-04-21 DIAGNOSIS — E1121 Type 2 diabetes mellitus with diabetic nephropathy: Secondary | ICD-10-CM | POA: Diagnosis not present

## 2019-04-26 DIAGNOSIS — C61 Malignant neoplasm of prostate: Secondary | ICD-10-CM | POA: Diagnosis not present

## 2019-04-26 DIAGNOSIS — E349 Endocrine disorder, unspecified: Secondary | ICD-10-CM | POA: Diagnosis not present

## 2019-04-26 DIAGNOSIS — R3915 Urgency of urination: Secondary | ICD-10-CM | POA: Diagnosis not present

## 2019-08-03 DIAGNOSIS — R609 Edema, unspecified: Secondary | ICD-10-CM | POA: Diagnosis not present

## 2019-10-02 IMAGING — MR MR LUMBAR SPINE WO/W CM
4 of 7 series · 26 of 48 positions shown · IV contrast (multihance)
Comparison: 08/21/2009

CLINICAL DATA: Lumbar radiculopathy. Dull aching low back pain
worse with activity. History of prostate cancer.

EXAM:
MRI LUMBAR SPINE WITHOUT AND WITH CONTRAST
TECHNIQUE: Multiplanar and multiecho pulse sequences of the lumbar spine were
obtained without and with intravenous contrast.
CONTRAST:  20mL MULTIHANCE GADOBENATE DIMEGLUMINE 529 MG/ML IV SOLN

[Series 3: T1 · sagittal · 4.0mm · 0.55mm/px · 3 of 19 slices shown (1 of 2)]
[im 1/19]
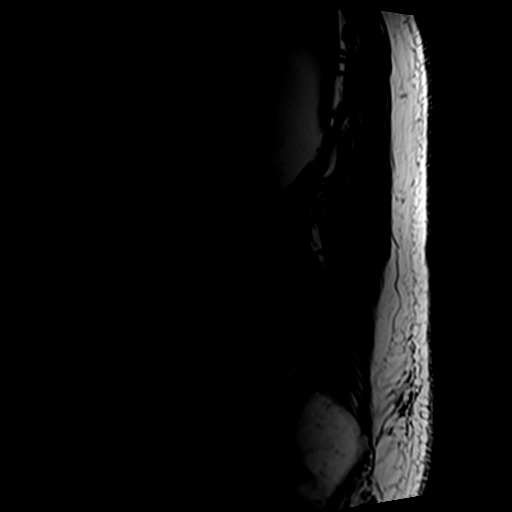
[im 10/19]
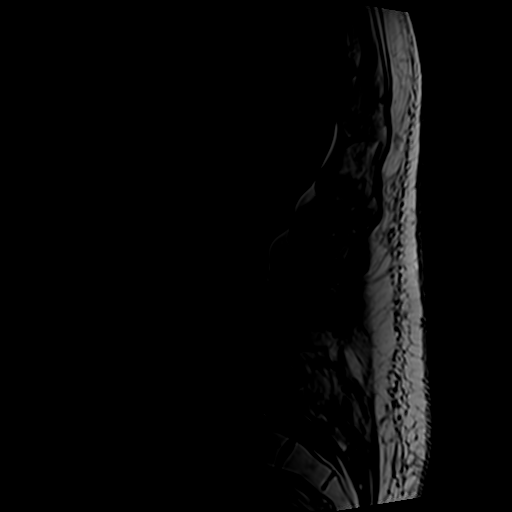
[im 19/19]
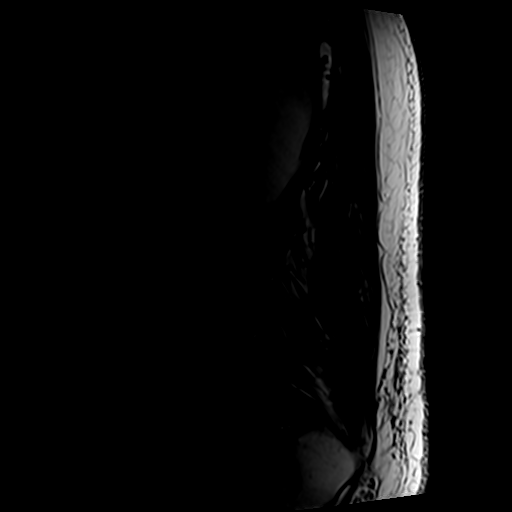

[Series 5: T2 · axial · 4.0mm · 0.70mm/px · z∈[-60,+184]mm · 11 of 50 slices shown (1 of 2)]
[im 1/50]
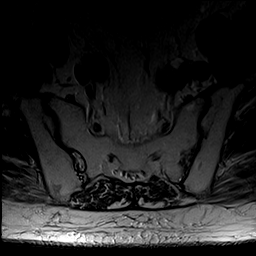
[im 5/50]
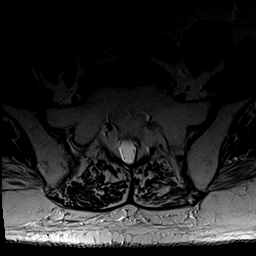
[im 10/50]
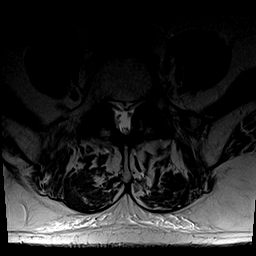
[im 15/50]
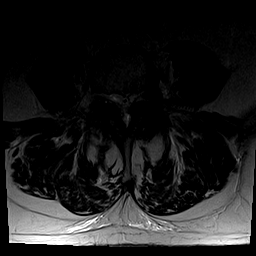
[im 20/50]
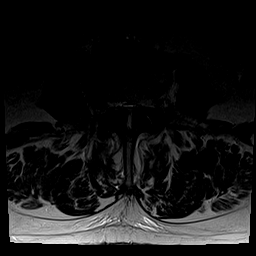
[im 25/50]
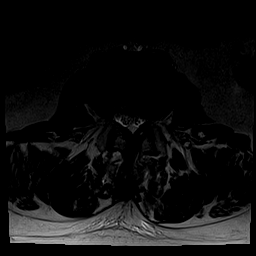
[im 30/50]
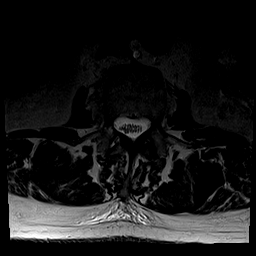
[im 35/50]
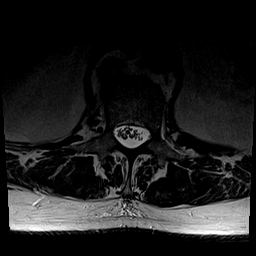
[im 40/50]
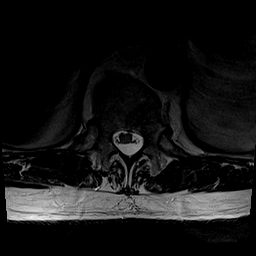
[im 45/50]
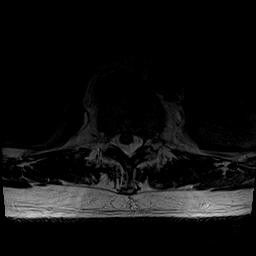
[im 50/50]
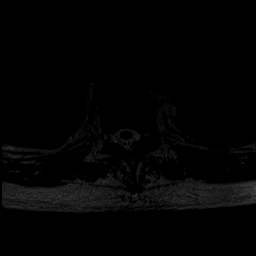

[Series 6: T1 · axial · 4.0mm · 0.35mm/px · z∈[-60,+169]mm · 8 of 50 slices shown (2 of 2)]
[im 1/50]
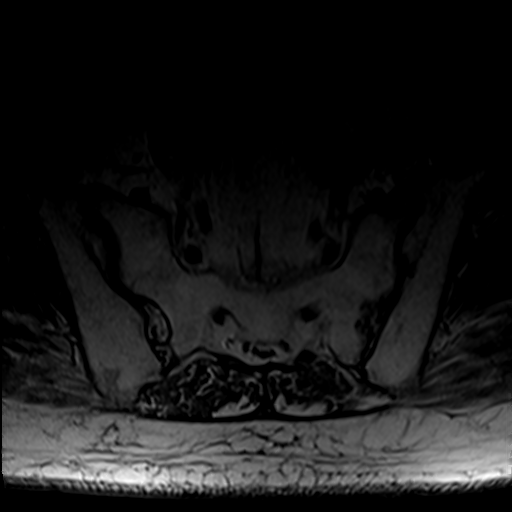
[im 5/50]
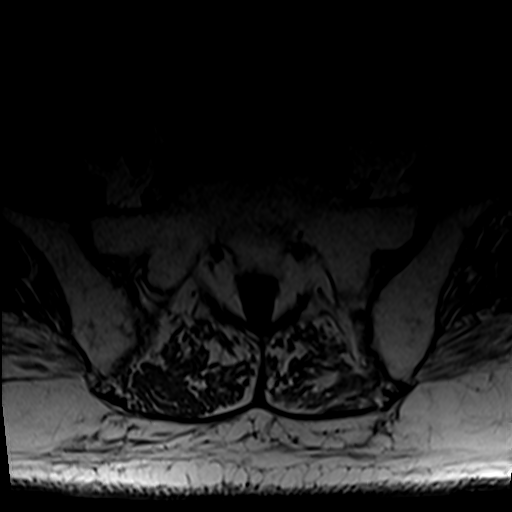
[im 10/50]
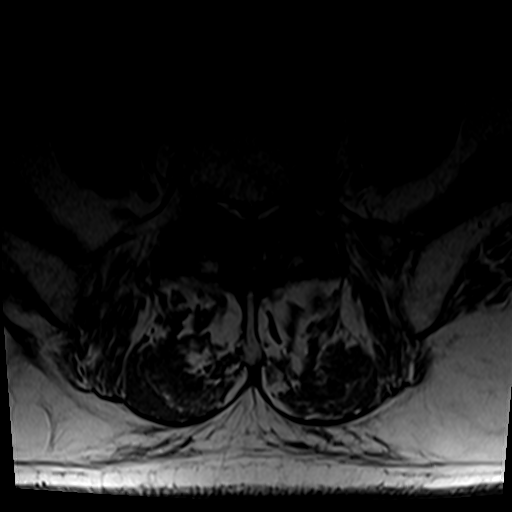
[im 15/50]
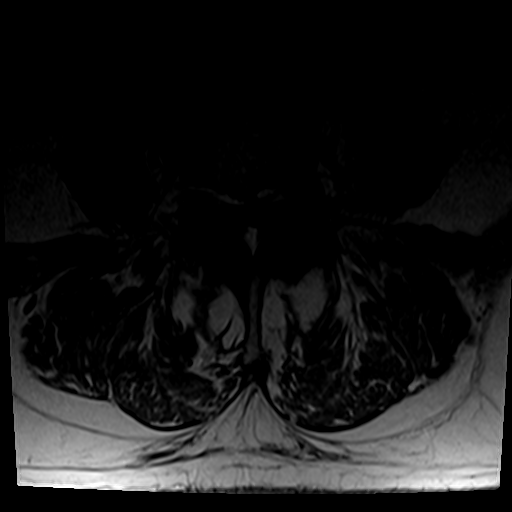
[im 20/50]
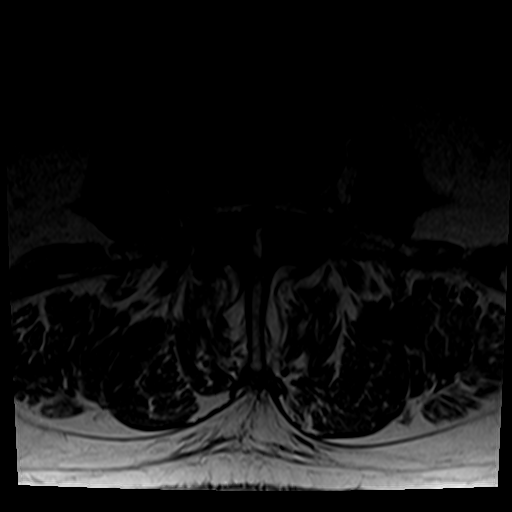
[im 25/50]
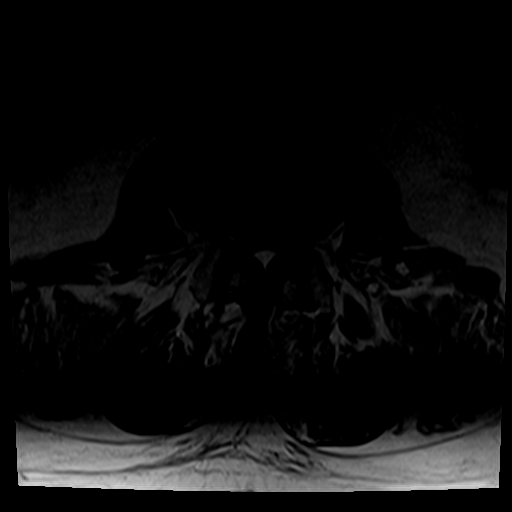
[im 30/50]
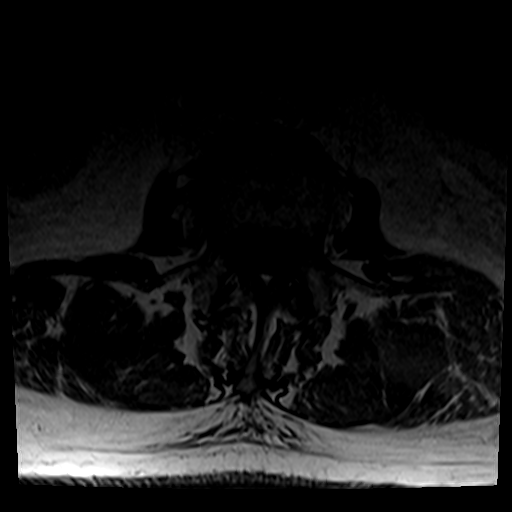
[im 45/50]
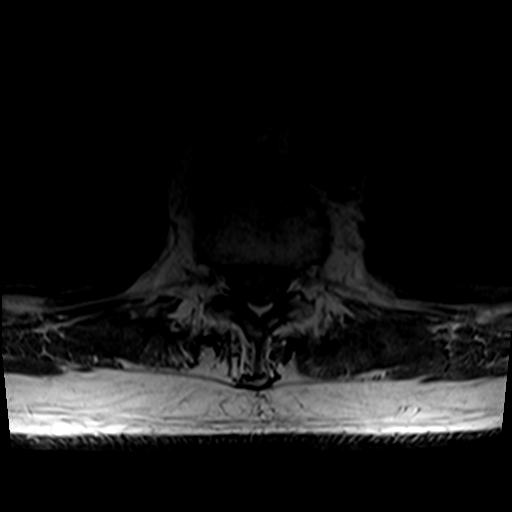

[Series 7: T2 · sagittal · 4.0mm · 0.55mm/px · 4 of 19 slices shown (2 of 2)]
[im 1/19]
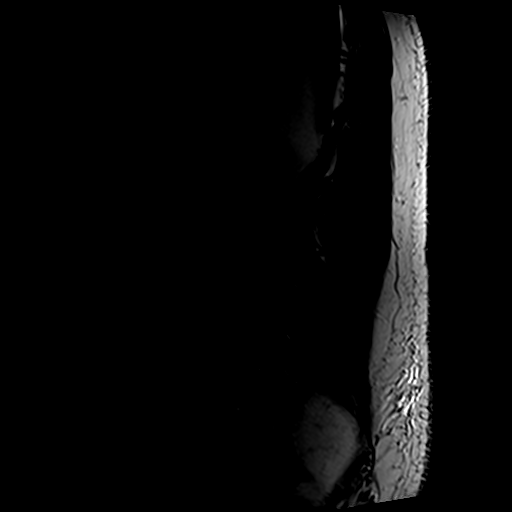
[im 7/19]
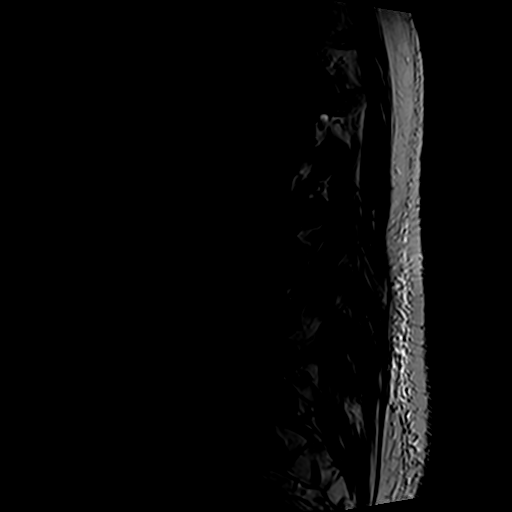
[im 13/19]
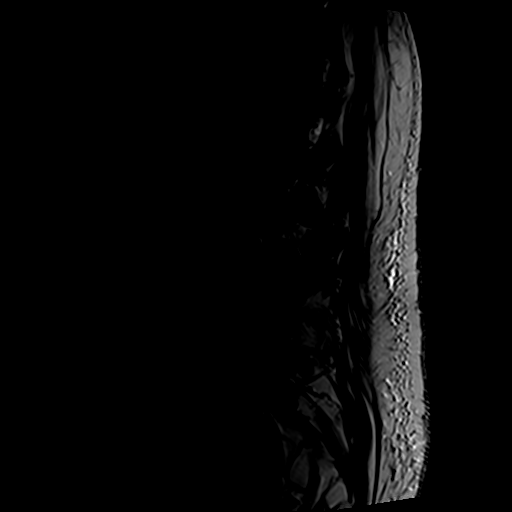
[im 19/19]
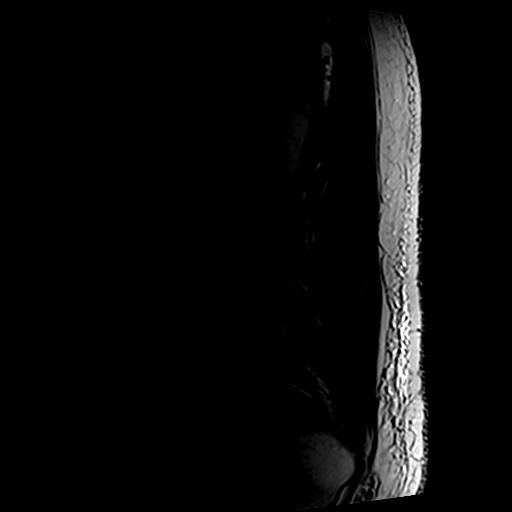

[26 of 48 positions shown; findings below may reference images not displayed]

FINDINGS: Segmentation:  Standard.

Alignment:  Minimal retrolisthesis of L1 on L2.

Vertebrae: No fracture, evidence of discitis, or aggressive bone
lesion.

Conus medullaris and cauda equina: Conus extends to the T12 level.
Conus and cauda equina appear normal.

Paraspinal and other soft tissues: No acute paraspinal abnormality.

Disc levels:

Disc spaces: Degenerative disc disease with disc height loss
throughout the lumbar spine.

T10-11: Minimal broad-based disc bulge. Mild bilateral facet
arthropathy. No foraminal or central canal stenosis.

T11-12: Mild broad-based disc bulge. Mild bilateral facet
arthropathy. No foraminal stenosis.

T12-L1: Mild broad-based disc bulge with a tiny left paracentral
disc protrusion. No evidence of neural foraminal stenosis. No
central canal stenosis.

L1-L2: Broad-based disc bulge eccentric towards the right. Moderate
bilateral facet arthropathy. Mild bilateral foraminal stenosis. Mild
spinal stenosis and bilateral lateral recess stenosis.

L2-L3: Broad-based disc bulge. Moderate bilateral facet arthropathy.
Moderate spinal stenosis and bilateral lateral recess stenosis. No
evidence of neural foraminal stenosis.

L3-L4: Broad-based disc bulge. Severe bilateral facet arthropathy.
Severe spinal stenosis. Moderate right foraminal stenosis. Mild left
foraminal stenosis.

L4-L5: Broad-based disc bulge. Severe bilateral facet arthropathy
with ligamentum flavum infolding. Severe spinal stenosis. Severe
left and moderate right foraminal stenosis.

L5-S1: Broad-based disc bulge. Moderate bilateral facet arthropathy.
Bilateral lateral recess stenosis. Mild bilateral foraminal
stenosis.
IMPRESSION: 1. Diffuse lumbar spine spondylosis as described above.
2. No aggressive osseous lesion to suggest osseous metastatic
disease.

## 2019-10-17 DIAGNOSIS — E1121 Type 2 diabetes mellitus with diabetic nephropathy: Secondary | ICD-10-CM | POA: Diagnosis not present

## 2019-10-17 DIAGNOSIS — I129 Hypertensive chronic kidney disease with stage 1 through stage 4 chronic kidney disease, or unspecified chronic kidney disease: Secondary | ICD-10-CM | POA: Diagnosis not present

## 2019-10-17 DIAGNOSIS — E7800 Pure hypercholesterolemia, unspecified: Secondary | ICD-10-CM | POA: Diagnosis not present

## 2019-10-17 DIAGNOSIS — Z23 Encounter for immunization: Secondary | ICD-10-CM | POA: Diagnosis not present

## 2019-10-17 DIAGNOSIS — N182 Chronic kidney disease, stage 2 (mild): Secondary | ICD-10-CM | POA: Diagnosis not present

## 2019-10-19 DIAGNOSIS — L720 Epidermal cyst: Secondary | ICD-10-CM | POA: Diagnosis not present

## 2019-10-19 DIAGNOSIS — L570 Actinic keratosis: Secondary | ICD-10-CM | POA: Diagnosis not present

## 2019-10-19 DIAGNOSIS — D485 Neoplasm of uncertain behavior of skin: Secondary | ICD-10-CM | POA: Diagnosis not present

## 2019-10-19 DIAGNOSIS — L820 Inflamed seborrheic keratosis: Secondary | ICD-10-CM | POA: Diagnosis not present

## 2019-10-19 DIAGNOSIS — D0359 Melanoma in situ of other part of trunk: Secondary | ICD-10-CM | POA: Diagnosis not present

## 2019-10-19 DIAGNOSIS — Z85828 Personal history of other malignant neoplasm of skin: Secondary | ICD-10-CM | POA: Diagnosis not present

## 2019-10-20 DIAGNOSIS — C61 Malignant neoplasm of prostate: Secondary | ICD-10-CM | POA: Diagnosis not present

## 2019-10-27 DIAGNOSIS — C61 Malignant neoplasm of prostate: Secondary | ICD-10-CM | POA: Diagnosis not present

## 2019-11-03 DIAGNOSIS — L988 Other specified disorders of the skin and subcutaneous tissue: Secondary | ICD-10-CM | POA: Diagnosis not present

## 2019-11-03 DIAGNOSIS — Z85828 Personal history of other malignant neoplasm of skin: Secondary | ICD-10-CM | POA: Diagnosis not present

## 2019-11-03 DIAGNOSIS — D0359 Melanoma in situ of other part of trunk: Secondary | ICD-10-CM | POA: Diagnosis not present

## 2019-11-17 DIAGNOSIS — I10 Essential (primary) hypertension: Secondary | ICD-10-CM | POA: Diagnosis not present

## 2019-11-17 DIAGNOSIS — Z7689 Persons encountering health services in other specified circumstances: Secondary | ICD-10-CM | POA: Diagnosis not present

## 2019-11-17 DIAGNOSIS — E119 Type 2 diabetes mellitus without complications: Secondary | ICD-10-CM | POA: Diagnosis not present

## 2020-02-07 DIAGNOSIS — R351 Nocturia: Secondary | ICD-10-CM | POA: Diagnosis not present

## 2020-02-07 DIAGNOSIS — C61 Malignant neoplasm of prostate: Secondary | ICD-10-CM | POA: Diagnosis not present

## 2020-02-07 DIAGNOSIS — N3941 Urge incontinence: Secondary | ICD-10-CM | POA: Diagnosis not present

## 2020-04-23 DIAGNOSIS — C61 Malignant neoplasm of prostate: Secondary | ICD-10-CM | POA: Diagnosis not present

## 2020-04-23 DIAGNOSIS — K219 Gastro-esophageal reflux disease without esophagitis: Secondary | ICD-10-CM | POA: Diagnosis not present

## 2020-04-23 DIAGNOSIS — Z86010 Personal history of colonic polyps: Secondary | ICD-10-CM | POA: Diagnosis not present

## 2020-04-23 DIAGNOSIS — N182 Chronic kidney disease, stage 2 (mild): Secondary | ICD-10-CM | POA: Diagnosis not present

## 2020-04-23 DIAGNOSIS — Z0000 Encounter for general adult medical examination without abnormal findings: Secondary | ICD-10-CM | POA: Diagnosis not present

## 2020-04-23 DIAGNOSIS — E1121 Type 2 diabetes mellitus with diabetic nephropathy: Secondary | ICD-10-CM | POA: Diagnosis not present

## 2020-04-23 DIAGNOSIS — I129 Hypertensive chronic kidney disease with stage 1 through stage 4 chronic kidney disease, or unspecified chronic kidney disease: Secondary | ICD-10-CM | POA: Diagnosis not present

## 2020-04-23 DIAGNOSIS — Z1389 Encounter for screening for other disorder: Secondary | ICD-10-CM | POA: Diagnosis not present

## 2020-04-23 DIAGNOSIS — E7800 Pure hypercholesterolemia, unspecified: Secondary | ICD-10-CM | POA: Diagnosis not present

## 2020-05-09 DIAGNOSIS — L570 Actinic keratosis: Secondary | ICD-10-CM | POA: Diagnosis not present

## 2020-05-09 DIAGNOSIS — D235 Other benign neoplasm of skin of trunk: Secondary | ICD-10-CM | POA: Diagnosis not present

## 2020-05-09 DIAGNOSIS — D225 Melanocytic nevi of trunk: Secondary | ICD-10-CM | POA: Diagnosis not present

## 2020-05-09 DIAGNOSIS — L814 Other melanin hyperpigmentation: Secondary | ICD-10-CM | POA: Diagnosis not present

## 2020-05-09 DIAGNOSIS — Z85820 Personal history of malignant melanoma of skin: Secondary | ICD-10-CM | POA: Diagnosis not present

## 2020-05-09 DIAGNOSIS — L578 Other skin changes due to chronic exposure to nonionizing radiation: Secondary | ICD-10-CM | POA: Diagnosis not present

## 2020-05-09 DIAGNOSIS — Z85828 Personal history of other malignant neoplasm of skin: Secondary | ICD-10-CM | POA: Diagnosis not present

## 2020-05-09 DIAGNOSIS — L821 Other seborrheic keratosis: Secondary | ICD-10-CM | POA: Diagnosis not present

## 2020-05-09 DIAGNOSIS — L918 Other hypertrophic disorders of the skin: Secondary | ICD-10-CM | POA: Diagnosis not present

## 2020-06-19 DIAGNOSIS — Z86010 Personal history of colonic polyps: Secondary | ICD-10-CM | POA: Diagnosis not present

## 2020-06-19 DIAGNOSIS — K219 Gastro-esophageal reflux disease without esophagitis: Secondary | ICD-10-CM | POA: Diagnosis not present

## 2020-06-22 DIAGNOSIS — E119 Type 2 diabetes mellitus without complications: Secondary | ICD-10-CM | POA: Diagnosis not present

## 2020-06-22 DIAGNOSIS — H35372 Puckering of macula, left eye: Secondary | ICD-10-CM | POA: Diagnosis not present

## 2020-08-07 DIAGNOSIS — C61 Malignant neoplasm of prostate: Secondary | ICD-10-CM | POA: Diagnosis not present

## 2020-08-10 DIAGNOSIS — D122 Benign neoplasm of ascending colon: Secondary | ICD-10-CM | POA: Diagnosis not present

## 2020-08-10 DIAGNOSIS — Z86010 Personal history of colonic polyps: Secondary | ICD-10-CM | POA: Diagnosis not present

## 2020-08-10 DIAGNOSIS — D123 Benign neoplasm of transverse colon: Secondary | ICD-10-CM | POA: Diagnosis not present

## 2020-08-14 DIAGNOSIS — D123 Benign neoplasm of transverse colon: Secondary | ICD-10-CM | POA: Diagnosis not present

## 2020-08-14 DIAGNOSIS — D122 Benign neoplasm of ascending colon: Secondary | ICD-10-CM | POA: Diagnosis not present

## 2020-08-16 DIAGNOSIS — E349 Endocrine disorder, unspecified: Secondary | ICD-10-CM | POA: Diagnosis not present

## 2020-08-16 DIAGNOSIS — C61 Malignant neoplasm of prostate: Secondary | ICD-10-CM | POA: Diagnosis not present

## 2020-10-23 DIAGNOSIS — E7800 Pure hypercholesterolemia, unspecified: Secondary | ICD-10-CM | POA: Diagnosis not present

## 2020-10-23 DIAGNOSIS — Z23 Encounter for immunization: Secondary | ICD-10-CM | POA: Diagnosis not present

## 2020-10-23 DIAGNOSIS — N182 Chronic kidney disease, stage 2 (mild): Secondary | ICD-10-CM | POA: Diagnosis not present

## 2020-10-23 DIAGNOSIS — E1121 Type 2 diabetes mellitus with diabetic nephropathy: Secondary | ICD-10-CM | POA: Diagnosis not present

## 2020-10-23 DIAGNOSIS — I129 Hypertensive chronic kidney disease with stage 1 through stage 4 chronic kidney disease, or unspecified chronic kidney disease: Secondary | ICD-10-CM | POA: Diagnosis not present

## 2020-11-08 DIAGNOSIS — L821 Other seborrheic keratosis: Secondary | ICD-10-CM | POA: Diagnosis not present

## 2020-11-08 DIAGNOSIS — L720 Epidermal cyst: Secondary | ICD-10-CM | POA: Diagnosis not present

## 2020-11-08 DIAGNOSIS — D225 Melanocytic nevi of trunk: Secondary | ICD-10-CM | POA: Diagnosis not present

## 2020-11-08 DIAGNOSIS — D2362 Other benign neoplasm of skin of left upper limb, including shoulder: Secondary | ICD-10-CM | POA: Diagnosis not present

## 2020-11-08 DIAGNOSIS — D0322 Melanoma in situ of left ear and external auricular canal: Secondary | ICD-10-CM | POA: Diagnosis not present

## 2020-11-08 DIAGNOSIS — Z85820 Personal history of malignant melanoma of skin: Secondary | ICD-10-CM | POA: Diagnosis not present

## 2020-11-08 DIAGNOSIS — L814 Other melanin hyperpigmentation: Secondary | ICD-10-CM | POA: Diagnosis not present

## 2020-11-08 DIAGNOSIS — L570 Actinic keratosis: Secondary | ICD-10-CM | POA: Diagnosis not present

## 2020-11-08 DIAGNOSIS — Z85828 Personal history of other malignant neoplasm of skin: Secondary | ICD-10-CM | POA: Diagnosis not present

## 2020-11-26 DIAGNOSIS — D0322 Melanoma in situ of left ear and external auricular canal: Secondary | ICD-10-CM | POA: Diagnosis not present

## 2020-11-26 DIAGNOSIS — L988 Other specified disorders of the skin and subcutaneous tissue: Secondary | ICD-10-CM | POA: Diagnosis not present

## 2020-11-26 DIAGNOSIS — Z85828 Personal history of other malignant neoplasm of skin: Secondary | ICD-10-CM | POA: Diagnosis not present

## 2020-11-27 DIAGNOSIS — D0322 Melanoma in situ of left ear and external auricular canal: Secondary | ICD-10-CM | POA: Diagnosis not present

## 2021-03-01 DIAGNOSIS — C61 Malignant neoplasm of prostate: Secondary | ICD-10-CM | POA: Diagnosis not present

## 2021-03-08 DIAGNOSIS — N3941 Urge incontinence: Secondary | ICD-10-CM | POA: Diagnosis not present

## 2021-03-08 DIAGNOSIS — C61 Malignant neoplasm of prostate: Secondary | ICD-10-CM | POA: Diagnosis not present

## 2021-03-08 DIAGNOSIS — R351 Nocturia: Secondary | ICD-10-CM | POA: Diagnosis not present

## 2021-03-08 DIAGNOSIS — N3944 Nocturnal enuresis: Secondary | ICD-10-CM | POA: Diagnosis not present

## 2021-03-29 DIAGNOSIS — N3941 Urge incontinence: Secondary | ICD-10-CM | POA: Diagnosis not present

## 2021-04-04 DIAGNOSIS — N3941 Urge incontinence: Secondary | ICD-10-CM | POA: Diagnosis not present

## 2021-04-11 DIAGNOSIS — N3941 Urge incontinence: Secondary | ICD-10-CM | POA: Diagnosis not present

## 2021-04-15 ENCOUNTER — Other Ambulatory Visit (HOSPITAL_COMMUNITY): Payer: Self-pay

## 2021-04-15 MED ORDER — METFORMIN HCL 500 MG PO TABS: 1000.0000 mg | ORAL_TABLET | Freq: Two times a day (BID) | ORAL | 3 refills | Status: AC

## 2021-04-15 MED ORDER — OMEPRAZOLE 40 MG PO CPDR
40.0000 mg | DELAYED_RELEASE_CAPSULE | Freq: Every day | ORAL | 0 refills | Status: AC
  Filled 2021-04-15: qty 90, 90d supply, fill #0

## 2021-04-16 ENCOUNTER — Other Ambulatory Visit (HOSPITAL_COMMUNITY): Payer: Self-pay

## 2021-04-16 MED ORDER — METFORMIN HCL 500 MG PO TABS
ORAL_TABLET | ORAL | 1 refills | Status: AC
  Filled 2021-04-16: qty 360, 90d supply, fill #0
  Filled 2021-09-12: qty 360, 90d supply, fill #1

## 2021-04-16 MED ORDER — GLIMEPIRIDE 4 MG PO TABS
ORAL_TABLET | ORAL | 1 refills | Status: AC
  Filled 2021-04-16: qty 90, 90d supply, fill #0
  Filled 2021-08-14: qty 90, 90d supply, fill #1

## 2021-04-16 MED ORDER — JANUVIA 100 MG PO TABS
ORAL_TABLET | ORAL | 1 refills | Status: AC
  Filled 2021-04-16: qty 30, 30d supply, fill #0

## 2021-04-16 MED ORDER — LOSARTAN POTASSIUM-HCTZ 100-12.5 MG PO TABS
ORAL_TABLET | ORAL | 1 refills | Status: AC
  Filled 2021-04-16: qty 90, 90d supply, fill #0
  Filled 2022-01-01: qty 90, 90d supply, fill #1

## 2021-04-16 MED ORDER — ATORVASTATIN CALCIUM 20 MG PO TABS
ORAL_TABLET | ORAL | 1 refills | Status: AC
  Filled 2021-04-16: qty 90, 90d supply, fill #0
  Filled 2021-08-14: qty 90, 90d supply, fill #1

## 2021-04-16 MED ORDER — OXYBUTYNIN CHLORIDE ER 5 MG PO TB24
ORAL_TABLET | ORAL | 0 refills | Status: DC
  Filled 2021-04-16 (×2): qty 90, 90d supply, fill #0

## 2021-04-16 MED ORDER — AMLODIPINE BESYLATE 5 MG PO TABS
ORAL_TABLET | ORAL | 1 refills | Status: AC
  Filled 2021-04-16: qty 90, 90d supply, fill #0
  Filled 2021-08-14: qty 90, 90d supply, fill #1

## 2021-04-16 MED ORDER — TAMSULOSIN HCL 0.4 MG PO CAPS
ORAL_CAPSULE | ORAL | 0 refills | Status: DC
  Filled 2021-04-16 (×2): qty 90, 90d supply, fill #0

## 2021-04-16 MED ORDER — OMEPRAZOLE 40 MG PO CPDR
DELAYED_RELEASE_CAPSULE | ORAL | 1 refills | Status: AC
  Filled 2021-04-16 – 2021-11-14 (×2): qty 90, 90d supply, fill #0

## 2021-04-18 DIAGNOSIS — N3941 Urge incontinence: Secondary | ICD-10-CM | POA: Diagnosis not present

## 2021-04-29 DIAGNOSIS — Z85820 Personal history of malignant melanoma of skin: Secondary | ICD-10-CM | POA: Diagnosis not present

## 2021-04-29 DIAGNOSIS — Z85828 Personal history of other malignant neoplasm of skin: Secondary | ICD-10-CM | POA: Diagnosis not present

## 2021-04-29 DIAGNOSIS — D225 Melanocytic nevi of trunk: Secondary | ICD-10-CM | POA: Diagnosis not present

## 2021-04-29 DIAGNOSIS — L821 Other seborrheic keratosis: Secondary | ICD-10-CM | POA: Diagnosis not present

## 2021-04-29 DIAGNOSIS — L720 Epidermal cyst: Secondary | ICD-10-CM | POA: Diagnosis not present

## 2021-04-29 DIAGNOSIS — L570 Actinic keratosis: Secondary | ICD-10-CM | POA: Diagnosis not present

## 2021-05-02 DIAGNOSIS — N3941 Urge incontinence: Secondary | ICD-10-CM | POA: Diagnosis not present

## 2021-05-08 DIAGNOSIS — C61 Malignant neoplasm of prostate: Secondary | ICD-10-CM | POA: Diagnosis not present

## 2021-05-08 DIAGNOSIS — D039 Melanoma in situ, unspecified: Secondary | ICD-10-CM | POA: Diagnosis not present

## 2021-05-08 DIAGNOSIS — Z86010 Personal history of colonic polyps: Secondary | ICD-10-CM | POA: Diagnosis not present

## 2021-05-08 DIAGNOSIS — E7800 Pure hypercholesterolemia, unspecified: Secondary | ICD-10-CM | POA: Diagnosis not present

## 2021-05-08 DIAGNOSIS — K219 Gastro-esophageal reflux disease without esophagitis: Secondary | ICD-10-CM | POA: Diagnosis not present

## 2021-05-08 DIAGNOSIS — N5201 Erectile dysfunction due to arterial insufficiency: Secondary | ICD-10-CM | POA: Diagnosis not present

## 2021-05-08 DIAGNOSIS — I129 Hypertensive chronic kidney disease with stage 1 through stage 4 chronic kidney disease, or unspecified chronic kidney disease: Secondary | ICD-10-CM | POA: Diagnosis not present

## 2021-05-08 DIAGNOSIS — Z0000 Encounter for general adult medical examination without abnormal findings: Secondary | ICD-10-CM | POA: Diagnosis not present

## 2021-05-08 DIAGNOSIS — E1121 Type 2 diabetes mellitus with diabetic nephropathy: Secondary | ICD-10-CM | POA: Diagnosis not present

## 2021-05-08 DIAGNOSIS — Z1331 Encounter for screening for depression: Secondary | ICD-10-CM | POA: Diagnosis not present

## 2021-05-08 DIAGNOSIS — N182 Chronic kidney disease, stage 2 (mild): Secondary | ICD-10-CM | POA: Diagnosis not present

## 2021-05-08 DIAGNOSIS — R1310 Dysphagia, unspecified: Secondary | ICD-10-CM | POA: Diagnosis not present

## 2021-05-09 DIAGNOSIS — R3915 Urgency of urination: Secondary | ICD-10-CM | POA: Diagnosis not present

## 2021-05-09 DIAGNOSIS — N3941 Urge incontinence: Secondary | ICD-10-CM | POA: Diagnosis not present

## 2021-05-16 DIAGNOSIS — N3941 Urge incontinence: Secondary | ICD-10-CM | POA: Diagnosis not present

## 2021-05-30 DIAGNOSIS — N3941 Urge incontinence: Secondary | ICD-10-CM | POA: Diagnosis not present

## 2021-06-06 DIAGNOSIS — N3941 Urge incontinence: Secondary | ICD-10-CM | POA: Diagnosis not present

## 2021-06-13 DIAGNOSIS — N3941 Urge incontinence: Secondary | ICD-10-CM | POA: Diagnosis not present

## 2021-06-20 DIAGNOSIS — N3941 Urge incontinence: Secondary | ICD-10-CM | POA: Diagnosis not present

## 2021-06-27 DIAGNOSIS — R3915 Urgency of urination: Secondary | ICD-10-CM | POA: Diagnosis not present

## 2021-06-27 DIAGNOSIS — N3941 Urge incontinence: Secondary | ICD-10-CM | POA: Diagnosis not present

## 2021-07-29 ENCOUNTER — Other Ambulatory Visit (HOSPITAL_COMMUNITY): Payer: Self-pay

## 2021-07-29 DIAGNOSIS — I739 Peripheral vascular disease, unspecified: Secondary | ICD-10-CM | POA: Diagnosis not present

## 2021-07-29 DIAGNOSIS — K219 Gastro-esophageal reflux disease without esophagitis: Secondary | ICD-10-CM | POA: Diagnosis not present

## 2021-07-29 DIAGNOSIS — E1121 Type 2 diabetes mellitus with diabetic nephropathy: Secondary | ICD-10-CM | POA: Diagnosis not present

## 2021-07-29 DIAGNOSIS — N182 Chronic kidney disease, stage 2 (mild): Secondary | ICD-10-CM | POA: Diagnosis not present

## 2021-07-29 DIAGNOSIS — J309 Allergic rhinitis, unspecified: Secondary | ICD-10-CM | POA: Diagnosis not present

## 2021-07-29 MED ORDER — OZEMPIC (0.25 OR 0.5 MG/DOSE) 2 MG/3ML ~~LOC~~ SOPN
PEN_INJECTOR | SUBCUTANEOUS | 6 refills | Status: AC
  Filled 2021-07-29 – 2021-09-09 (×3): qty 3, 28d supply, fill #0
  Filled 2021-10-08: qty 3, 28d supply, fill #1
  Filled 2021-11-04: qty 3, 28d supply, fill #2

## 2021-07-30 DIAGNOSIS — N3941 Urge incontinence: Secondary | ICD-10-CM | POA: Diagnosis not present

## 2021-07-31 DIAGNOSIS — I739 Peripheral vascular disease, unspecified: Secondary | ICD-10-CM | POA: Diagnosis not present

## 2021-08-12 ENCOUNTER — Other Ambulatory Visit (HOSPITAL_COMMUNITY): Payer: Self-pay

## 2021-08-12 DIAGNOSIS — R1310 Dysphagia, unspecified: Secondary | ICD-10-CM | POA: Diagnosis not present

## 2021-08-12 DIAGNOSIS — K219 Gastro-esophageal reflux disease without esophagitis: Secondary | ICD-10-CM | POA: Diagnosis not present

## 2021-08-12 DIAGNOSIS — Z86010 Personal history of colonic polyps: Secondary | ICD-10-CM | POA: Diagnosis not present

## 2021-08-12 MED ORDER — OMEPRAZOLE 40 MG PO CPDR
DELAYED_RELEASE_CAPSULE | ORAL | 4 refills | Status: DC
  Filled 2021-08-12: qty 90, 90d supply, fill #0
  Filled 2022-05-12: qty 90, 90d supply, fill #1
  Filled 2022-08-07: qty 90, 90d supply, fill #2

## 2021-08-14 ENCOUNTER — Other Ambulatory Visit (HOSPITAL_COMMUNITY): Payer: Self-pay

## 2021-08-19 ENCOUNTER — Other Ambulatory Visit: Payer: Self-pay | Admitting: Radiation Oncology

## 2021-08-19 ENCOUNTER — Other Ambulatory Visit (HOSPITAL_COMMUNITY): Payer: Self-pay

## 2021-08-19 MED ORDER — OXYBUTYNIN CHLORIDE ER 5 MG PO TB24
ORAL_TABLET | ORAL | 0 refills | Status: DC
  Filled 2021-08-19: qty 90, 90d supply, fill #0

## 2021-08-20 ENCOUNTER — Other Ambulatory Visit (HOSPITAL_COMMUNITY): Payer: Self-pay

## 2021-08-20 MED ORDER — TAMSULOSIN HCL 0.4 MG PO CAPS
0.4000 mg | ORAL_CAPSULE | Freq: Every day | ORAL | 0 refills | Status: DC
  Filled 2021-08-20: qty 90, 90d supply, fill #0

## 2021-08-21 ENCOUNTER — Other Ambulatory Visit (HOSPITAL_COMMUNITY): Payer: Self-pay

## 2021-08-21 MED ORDER — INSULIN PEN NEEDLE 31G X 8 MM MISC
0 refills | Status: AC
  Filled 2021-08-30: qty 100, 90d supply, fill #0

## 2021-08-30 ENCOUNTER — Other Ambulatory Visit (HOSPITAL_COMMUNITY): Payer: Self-pay

## 2021-09-05 DIAGNOSIS — N3941 Urge incontinence: Secondary | ICD-10-CM | POA: Diagnosis not present

## 2021-09-09 ENCOUNTER — Other Ambulatory Visit (HOSPITAL_COMMUNITY): Payer: Self-pay

## 2021-09-11 ENCOUNTER — Other Ambulatory Visit (HOSPITAL_COMMUNITY): Payer: Self-pay

## 2021-09-12 ENCOUNTER — Other Ambulatory Visit (HOSPITAL_COMMUNITY): Payer: Self-pay

## 2021-09-27 ENCOUNTER — Other Ambulatory Visit (HOSPITAL_COMMUNITY): Payer: Self-pay

## 2021-09-27 MED ORDER — CYCLOBENZAPRINE HCL 5 MG PO TABS
5.0000 mg | ORAL_TABLET | Freq: Three times a day (TID) | ORAL | 0 refills | Status: AC | PRN
  Filled 2021-09-27: qty 20, 7d supply, fill #0

## 2021-10-01 DIAGNOSIS — N3941 Urge incontinence: Secondary | ICD-10-CM | POA: Diagnosis not present

## 2021-10-08 ENCOUNTER — Other Ambulatory Visit (HOSPITAL_COMMUNITY): Payer: Self-pay

## 2021-10-21 DIAGNOSIS — E119 Type 2 diabetes mellitus without complications: Secondary | ICD-10-CM | POA: Diagnosis not present

## 2021-10-29 DIAGNOSIS — N3941 Urge incontinence: Secondary | ICD-10-CM | POA: Diagnosis not present

## 2021-11-04 ENCOUNTER — Other Ambulatory Visit (HOSPITAL_COMMUNITY): Payer: Self-pay

## 2021-11-05 ENCOUNTER — Other Ambulatory Visit (HOSPITAL_COMMUNITY): Payer: Self-pay

## 2021-11-06 ENCOUNTER — Other Ambulatory Visit (HOSPITAL_COMMUNITY): Payer: Self-pay

## 2021-11-07 DIAGNOSIS — E7800 Pure hypercholesterolemia, unspecified: Secondary | ICD-10-CM | POA: Diagnosis not present

## 2021-11-07 DIAGNOSIS — N182 Chronic kidney disease, stage 2 (mild): Secondary | ICD-10-CM | POA: Diagnosis not present

## 2021-11-07 DIAGNOSIS — Z23 Encounter for immunization: Secondary | ICD-10-CM | POA: Diagnosis not present

## 2021-11-07 DIAGNOSIS — I129 Hypertensive chronic kidney disease with stage 1 through stage 4 chronic kidney disease, or unspecified chronic kidney disease: Secondary | ICD-10-CM | POA: Diagnosis not present

## 2021-11-07 DIAGNOSIS — E1121 Type 2 diabetes mellitus with diabetic nephropathy: Secondary | ICD-10-CM | POA: Diagnosis not present

## 2021-11-12 ENCOUNTER — Other Ambulatory Visit (HOSPITAL_COMMUNITY): Payer: Self-pay

## 2021-11-12 MED ORDER — OZEMPIC (1 MG/DOSE) 4 MG/3ML ~~LOC~~ SOPN
1.0000 mg | PEN_INJECTOR | SUBCUTANEOUS | 1 refills | Status: AC
  Filled 2021-11-12: qty 3, 28d supply, fill #0
  Filled 2021-12-26: qty 3, 28d supply, fill #1
  Filled 2022-01-23: qty 9, 84d supply, fill #2
  Filled 2022-04-15 – 2022-10-16 (×4): qty 3, 28d supply, fill #3

## 2021-11-13 ENCOUNTER — Other Ambulatory Visit (HOSPITAL_COMMUNITY): Payer: Self-pay

## 2021-11-14 ENCOUNTER — Other Ambulatory Visit (HOSPITAL_COMMUNITY): Payer: Self-pay

## 2021-11-14 MED ORDER — GLIMEPIRIDE 4 MG PO TABS
4.0000 mg | ORAL_TABLET | ORAL | 1 refills | Status: AC
  Filled 2021-11-14: qty 90, 90d supply, fill #0
  Filled 2022-02-08: qty 90, 90d supply, fill #1

## 2021-11-14 MED ORDER — ATORVASTATIN CALCIUM 20 MG PO TABS
20.0000 mg | ORAL_TABLET | Freq: Every day | ORAL | 1 refills | Status: DC
  Filled 2021-11-14: qty 90, 90d supply, fill #0
  Filled 2022-02-08: qty 90, 90d supply, fill #1

## 2021-11-14 MED ORDER — AMLODIPINE BESYLATE 5 MG PO TABS
5.0000 mg | ORAL_TABLET | Freq: Every day | ORAL | 1 refills | Status: AC
  Filled 2021-11-14: qty 90, 90d supply, fill #0
  Filled 2022-03-04: qty 90, 90d supply, fill #1

## 2021-11-19 ENCOUNTER — Other Ambulatory Visit (HOSPITAL_COMMUNITY): Payer: Self-pay

## 2021-11-20 ENCOUNTER — Other Ambulatory Visit (HOSPITAL_COMMUNITY): Payer: Self-pay

## 2021-11-20 MED ORDER — OXYBUTYNIN CHLORIDE ER 5 MG PO TB24
5.0000 mg | ORAL_TABLET | Freq: Every day | ORAL | 0 refills | Status: DC
  Filled 2021-11-20: qty 90, 90d supply, fill #0

## 2021-11-22 ENCOUNTER — Other Ambulatory Visit (HOSPITAL_COMMUNITY): Payer: Self-pay

## 2021-11-23 ENCOUNTER — Other Ambulatory Visit (HOSPITAL_COMMUNITY): Payer: Self-pay

## 2021-11-23 MED ORDER — TAMSULOSIN HCL 0.4 MG PO CAPS
0.4000 mg | ORAL_CAPSULE | Freq: Every day | ORAL | 0 refills | Status: DC
  Filled 2021-11-23: qty 90, 90d supply, fill #0

## 2021-11-26 DIAGNOSIS — N3941 Urge incontinence: Secondary | ICD-10-CM | POA: Diagnosis not present

## 2021-12-03 ENCOUNTER — Other Ambulatory Visit (HOSPITAL_COMMUNITY): Payer: Self-pay

## 2021-12-04 ENCOUNTER — Other Ambulatory Visit (HOSPITAL_COMMUNITY): Payer: Self-pay

## 2021-12-04 MED ORDER — METFORMIN HCL 500 MG PO TABS
1000.0000 mg | ORAL_TABLET | Freq: Two times a day (BID) | ORAL | 1 refills | Status: AC
  Filled 2021-12-04: qty 360, 90d supply, fill #0
  Filled 2022-02-08 – 2022-03-04 (×2): qty 360, 90d supply, fill #1

## 2021-12-06 ENCOUNTER — Other Ambulatory Visit (HOSPITAL_COMMUNITY): Payer: Self-pay

## 2021-12-24 DIAGNOSIS — R3915 Urgency of urination: Secondary | ICD-10-CM | POA: Diagnosis not present

## 2021-12-24 DIAGNOSIS — N3941 Urge incontinence: Secondary | ICD-10-CM | POA: Diagnosis not present

## 2021-12-26 ENCOUNTER — Other Ambulatory Visit (HOSPITAL_COMMUNITY): Payer: Self-pay

## 2021-12-27 ENCOUNTER — Other Ambulatory Visit: Payer: Self-pay

## 2021-12-30 ENCOUNTER — Other Ambulatory Visit (HOSPITAL_COMMUNITY): Payer: Self-pay

## 2021-12-30 ENCOUNTER — Encounter (HOSPITAL_COMMUNITY): Payer: Self-pay

## 2021-12-31 ENCOUNTER — Other Ambulatory Visit (HOSPITAL_COMMUNITY): Payer: Self-pay

## 2022-01-01 ENCOUNTER — Other Ambulatory Visit (HOSPITAL_COMMUNITY): Payer: Self-pay

## 2022-01-09 DIAGNOSIS — Z85820 Personal history of malignant melanoma of skin: Secondary | ICD-10-CM | POA: Diagnosis not present

## 2022-01-09 DIAGNOSIS — L720 Epidermal cyst: Secondary | ICD-10-CM | POA: Diagnosis not present

## 2022-01-09 DIAGNOSIS — L570 Actinic keratosis: Secondary | ICD-10-CM | POA: Diagnosis not present

## 2022-01-09 DIAGNOSIS — L905 Scar conditions and fibrosis of skin: Secondary | ICD-10-CM | POA: Diagnosis not present

## 2022-01-09 DIAGNOSIS — D1801 Hemangioma of skin and subcutaneous tissue: Secondary | ICD-10-CM | POA: Diagnosis not present

## 2022-01-09 DIAGNOSIS — D225 Melanocytic nevi of trunk: Secondary | ICD-10-CM | POA: Diagnosis not present

## 2022-01-09 DIAGNOSIS — Z85828 Personal history of other malignant neoplasm of skin: Secondary | ICD-10-CM | POA: Diagnosis not present

## 2022-01-09 DIAGNOSIS — L821 Other seborrheic keratosis: Secondary | ICD-10-CM | POA: Diagnosis not present

## 2022-01-09 DIAGNOSIS — D2272 Melanocytic nevi of left lower limb, including hip: Secondary | ICD-10-CM | POA: Diagnosis not present

## 2022-01-09 DIAGNOSIS — D2371 Other benign neoplasm of skin of right lower limb, including hip: Secondary | ICD-10-CM | POA: Diagnosis not present

## 2022-01-21 DIAGNOSIS — R3915 Urgency of urination: Secondary | ICD-10-CM | POA: Diagnosis not present

## 2022-01-23 ENCOUNTER — Other Ambulatory Visit (HOSPITAL_COMMUNITY): Payer: Self-pay

## 2022-02-08 ENCOUNTER — Other Ambulatory Visit (HOSPITAL_COMMUNITY): Payer: Self-pay

## 2022-02-08 MED ORDER — TAMSULOSIN HCL 0.4 MG PO CAPS
0.4000 mg | ORAL_CAPSULE | Freq: Every day | ORAL | 0 refills | Status: DC
  Filled 2022-02-08: qty 90, 90d supply, fill #0

## 2022-02-08 MED ORDER — OXYBUTYNIN CHLORIDE ER 5 MG PO TB24
5.0000 mg | ORAL_TABLET | Freq: Every day | ORAL | 0 refills | Status: DC
  Filled 2022-02-08: qty 90, 90d supply, fill #0

## 2022-02-10 ENCOUNTER — Other Ambulatory Visit: Payer: Self-pay

## 2022-02-11 ENCOUNTER — Other Ambulatory Visit (HOSPITAL_COMMUNITY): Payer: Self-pay

## 2022-02-12 ENCOUNTER — Other Ambulatory Visit: Payer: Self-pay

## 2022-02-12 ENCOUNTER — Other Ambulatory Visit (HOSPITAL_COMMUNITY): Payer: Self-pay

## 2022-02-12 MED ORDER — ACCU-CHEK FASTCLIX LANCETS MISC
3 refills | Status: AC
  Filled 2022-02-15: qty 102, 100d supply, fill #0

## 2022-02-12 MED ORDER — ACCU-CHEK AVIVA PLUS VI STRP
ORAL_STRIP | 3 refills | Status: AC
  Filled 2022-02-12: qty 100, 100d supply, fill #0
  Filled 2022-06-10: qty 100, 100d supply, fill #1

## 2022-02-12 MED ORDER — ACCU-CHEK AVIVA PLUS W/DEVICE KIT
PACK | 3 refills | Status: AC
  Filled 2022-02-15: qty 1, fill #0

## 2022-02-12 MED ORDER — ACCU-CHEK AVIVA PLUS VI STRP
ORAL_STRIP | 3 refills | Status: DC
  Filled 2022-02-12: qty 100, 100d supply, fill #0

## 2022-02-17 ENCOUNTER — Other Ambulatory Visit (HOSPITAL_COMMUNITY): Payer: Self-pay

## 2022-02-17 ENCOUNTER — Other Ambulatory Visit: Payer: Self-pay

## 2022-02-18 ENCOUNTER — Other Ambulatory Visit: Payer: Self-pay

## 2022-02-19 ENCOUNTER — Other Ambulatory Visit (HOSPITAL_COMMUNITY): Payer: Self-pay

## 2022-02-20 ENCOUNTER — Other Ambulatory Visit (HOSPITAL_COMMUNITY): Payer: Self-pay

## 2022-02-22 ENCOUNTER — Other Ambulatory Visit (HOSPITAL_COMMUNITY): Payer: Self-pay

## 2022-02-22 MED ORDER — LANCETS MICRO THIN 33G MISC
1 refills | Status: AC
  Filled 2022-02-22: qty 100, 90d supply, fill #0

## 2022-02-22 MED ORDER — ONETOUCH ULTRA 2 W/DEVICE KIT
PACK | 0 refills | Status: AC
  Filled 2022-02-22: qty 1, 30d supply, fill #0

## 2022-02-22 MED ORDER — GLUCOSE BLOOD VI STRP
ORAL_STRIP | 1 refills | Status: AC
  Filled 2022-02-22: qty 100, 90d supply, fill #0

## 2022-02-24 ENCOUNTER — Other Ambulatory Visit (HOSPITAL_COMMUNITY): Payer: Self-pay

## 2022-02-24 ENCOUNTER — Other Ambulatory Visit: Payer: Self-pay

## 2022-02-25 ENCOUNTER — Other Ambulatory Visit: Payer: Self-pay

## 2022-02-25 ENCOUNTER — Other Ambulatory Visit (HOSPITAL_COMMUNITY): Payer: Self-pay

## 2022-02-27 ENCOUNTER — Other Ambulatory Visit (HOSPITAL_COMMUNITY): Payer: Self-pay

## 2022-03-05 ENCOUNTER — Other Ambulatory Visit (HOSPITAL_COMMUNITY): Payer: Self-pay

## 2022-03-11 ENCOUNTER — Other Ambulatory Visit (HOSPITAL_BASED_OUTPATIENT_CLINIC_OR_DEPARTMENT_OTHER): Payer: Self-pay

## 2022-03-11 ENCOUNTER — Other Ambulatory Visit (HOSPITAL_COMMUNITY): Payer: Self-pay

## 2022-03-11 MED ORDER — GLUCOSE BLOOD VI STRP
ORAL_STRIP | 3 refills | Status: AC
  Filled 2022-03-11: qty 100, 90d supply, fill #0
  Filled 2022-03-18: qty 100, 100d supply, fill #0
  Filled 2022-03-19: qty 100, 90d supply, fill #0

## 2022-03-18 ENCOUNTER — Other Ambulatory Visit (HOSPITAL_COMMUNITY): Payer: Self-pay

## 2022-03-19 ENCOUNTER — Other Ambulatory Visit (HOSPITAL_COMMUNITY): Payer: Self-pay

## 2022-03-20 ENCOUNTER — Other Ambulatory Visit: Payer: Self-pay

## 2022-03-20 MED ORDER — GLUCOSE BLOOD VI STRP
1.0000 | ORAL_STRIP | Freq: Every day | 3 refills | Status: AC
  Filled 2022-03-20: qty 100, 100d supply, fill #0

## 2022-03-25 ENCOUNTER — Other Ambulatory Visit (HOSPITAL_COMMUNITY): Payer: Self-pay

## 2022-03-26 ENCOUNTER — Other Ambulatory Visit: Payer: Self-pay

## 2022-03-26 ENCOUNTER — Other Ambulatory Visit (HOSPITAL_COMMUNITY): Payer: Self-pay

## 2022-03-26 MED ORDER — LOSARTAN POTASSIUM-HCTZ 100-12.5 MG PO TABS
1.0000 | ORAL_TABLET | Freq: Every day | ORAL | 0 refills | Status: DC
  Filled 2022-03-26: qty 90, 90d supply, fill #0

## 2022-04-15 ENCOUNTER — Other Ambulatory Visit: Payer: Self-pay

## 2022-04-21 ENCOUNTER — Other Ambulatory Visit: Payer: Self-pay

## 2022-04-22 ENCOUNTER — Other Ambulatory Visit (HOSPITAL_COMMUNITY): Payer: Self-pay

## 2022-04-23 ENCOUNTER — Other Ambulatory Visit (HOSPITAL_COMMUNITY): Payer: Self-pay

## 2022-04-23 ENCOUNTER — Encounter: Payer: Self-pay | Admitting: Pharmacist

## 2022-04-23 ENCOUNTER — Other Ambulatory Visit: Payer: Self-pay

## 2022-04-23 MED ORDER — OZEMPIC (1 MG/DOSE) 4 MG/3ML ~~LOC~~ SOPN
1.0000 mg | PEN_INJECTOR | SUBCUTANEOUS | 1 refills | Status: AC
  Filled 2022-04-23 – 2022-04-29 (×3): qty 9, 84d supply, fill #0
  Filled 2022-07-15: qty 9, 84d supply, fill #1

## 2022-04-25 ENCOUNTER — Other Ambulatory Visit: Payer: Self-pay

## 2022-04-28 ENCOUNTER — Other Ambulatory Visit (HOSPITAL_COMMUNITY): Payer: Self-pay

## 2022-04-29 ENCOUNTER — Other Ambulatory Visit (HOSPITAL_COMMUNITY): Payer: Self-pay

## 2022-05-12 ENCOUNTER — Other Ambulatory Visit (HOSPITAL_COMMUNITY): Payer: Self-pay

## 2022-05-13 ENCOUNTER — Other Ambulatory Visit: Payer: Self-pay

## 2022-05-13 ENCOUNTER — Other Ambulatory Visit (HOSPITAL_COMMUNITY): Payer: Self-pay

## 2022-05-13 MED ORDER — METFORMIN HCL 500 MG PO TABS
1000.0000 mg | ORAL_TABLET | Freq: Two times a day (BID) | ORAL | 0 refills | Status: DC
  Filled 2022-05-13: qty 360, 90d supply, fill #0

## 2022-05-13 MED ORDER — ATORVASTATIN CALCIUM 20 MG PO TABS
20.0000 mg | ORAL_TABLET | Freq: Every day | ORAL | 1 refills | Status: DC
  Filled 2022-05-13: qty 90, 90d supply, fill #0
  Filled 2022-08-07: qty 90, 90d supply, fill #1

## 2022-05-14 ENCOUNTER — Other Ambulatory Visit: Payer: Self-pay

## 2022-05-21 ENCOUNTER — Other Ambulatory Visit (HOSPITAL_COMMUNITY): Payer: Self-pay

## 2022-05-22 ENCOUNTER — Other Ambulatory Visit (HOSPITAL_COMMUNITY): Payer: Self-pay

## 2022-05-25 ENCOUNTER — Other Ambulatory Visit (HOSPITAL_COMMUNITY): Payer: Self-pay

## 2022-05-26 ENCOUNTER — Other Ambulatory Visit (HOSPITAL_COMMUNITY): Payer: Self-pay

## 2022-05-26 ENCOUNTER — Other Ambulatory Visit: Payer: Self-pay

## 2022-05-26 MED ORDER — OXYBUTYNIN CHLORIDE ER 5 MG PO TB24
5.0000 mg | ORAL_TABLET | Freq: Every day | ORAL | 0 refills | Status: DC
  Filled 2022-05-26: qty 90, 90d supply, fill #0

## 2022-06-10 ENCOUNTER — Other Ambulatory Visit: Payer: Self-pay

## 2022-06-17 ENCOUNTER — Other Ambulatory Visit (HOSPITAL_COMMUNITY): Payer: Self-pay

## 2022-06-17 MED ORDER — LOSARTAN POTASSIUM-HCTZ 100-12.5 MG PO TABS
1.0000 | ORAL_TABLET | Freq: Every day | ORAL | 1 refills | Status: AC
  Filled 2022-06-17: qty 90, 90d supply, fill #0
  Filled 2022-11-02: qty 90, 90d supply, fill #1

## 2022-06-23 ENCOUNTER — Other Ambulatory Visit (HOSPITAL_COMMUNITY): Payer: Self-pay

## 2022-06-24 ENCOUNTER — Other Ambulatory Visit (HOSPITAL_COMMUNITY): Payer: Self-pay

## 2022-06-24 MED ORDER — LOSARTAN POTASSIUM-HCTZ 100-12.5 MG PO TABS
1.0000 | ORAL_TABLET | Freq: Every day | ORAL | 1 refills | Status: DC
  Filled 2022-06-24 – 2023-01-27 (×2): qty 90, 90d supply, fill #0
  Filled 2023-04-29: qty 90, 90d supply, fill #1

## 2022-07-02 ENCOUNTER — Other Ambulatory Visit (HOSPITAL_COMMUNITY): Payer: Self-pay

## 2022-07-03 ENCOUNTER — Other Ambulatory Visit (HOSPITAL_COMMUNITY): Payer: Self-pay

## 2022-07-04 ENCOUNTER — Other Ambulatory Visit (HOSPITAL_COMMUNITY): Payer: Self-pay

## 2022-07-05 ENCOUNTER — Other Ambulatory Visit (HOSPITAL_COMMUNITY): Payer: Self-pay

## 2022-07-07 ENCOUNTER — Other Ambulatory Visit (HOSPITAL_COMMUNITY): Payer: Self-pay

## 2022-07-15 ENCOUNTER — Other Ambulatory Visit (HOSPITAL_COMMUNITY): Payer: Self-pay

## 2022-07-15 ENCOUNTER — Other Ambulatory Visit: Payer: Self-pay

## 2022-08-08 ENCOUNTER — Other Ambulatory Visit (HOSPITAL_COMMUNITY): Payer: Self-pay

## 2022-08-29 ENCOUNTER — Other Ambulatory Visit (HOSPITAL_COMMUNITY): Payer: Self-pay

## 2022-08-29 ENCOUNTER — Other Ambulatory Visit: Payer: Self-pay

## 2022-08-29 MED ORDER — OXYBUTYNIN CHLORIDE ER 5 MG PO TB24
5.0000 mg | ORAL_TABLET | Freq: Every day | ORAL | 0 refills | Status: DC
  Filled 2022-08-29: qty 90, 90d supply, fill #0

## 2022-08-29 MED ORDER — METFORMIN HCL 500 MG PO TABS
1000.0000 mg | ORAL_TABLET | Freq: Two times a day (BID) | ORAL | 0 refills | Status: DC
  Filled 2022-08-29: qty 360, 90d supply, fill #0

## 2022-10-10 ENCOUNTER — Encounter: Payer: Self-pay | Admitting: Pharmacist

## 2022-10-10 ENCOUNTER — Other Ambulatory Visit (HOSPITAL_COMMUNITY): Payer: Self-pay

## 2022-10-10 ENCOUNTER — Other Ambulatory Visit: Payer: Self-pay

## 2022-10-14 ENCOUNTER — Other Ambulatory Visit: Payer: Self-pay

## 2022-10-16 ENCOUNTER — Other Ambulatory Visit (HOSPITAL_COMMUNITY): Payer: Self-pay

## 2022-11-02 ENCOUNTER — Other Ambulatory Visit (HOSPITAL_COMMUNITY): Payer: Self-pay

## 2022-11-03 ENCOUNTER — Other Ambulatory Visit: Payer: Self-pay

## 2022-11-03 ENCOUNTER — Other Ambulatory Visit (HOSPITAL_COMMUNITY): Payer: Self-pay

## 2022-11-03 MED ORDER — METFORMIN HCL 500 MG PO TABS
1000.0000 mg | ORAL_TABLET | Freq: Two times a day (BID) | ORAL | 0 refills | Status: DC
  Filled 2022-11-26: qty 360, 90d supply, fill #0

## 2022-11-03 MED ORDER — OXYBUTYNIN CHLORIDE ER 5 MG PO TB24
5.0000 mg | ORAL_TABLET | Freq: Every day | ORAL | 0 refills | Status: DC
  Filled 2022-11-23: qty 90, 90d supply, fill #0

## 2022-11-03 MED ORDER — TAMSULOSIN HCL 0.4 MG PO CAPS
0.4000 mg | ORAL_CAPSULE | Freq: Every day | ORAL | 0 refills | Status: DC
  Filled 2022-11-03: qty 90, 90d supply, fill #0

## 2022-11-03 MED ORDER — ATORVASTATIN CALCIUM 20 MG PO TABS
20.0000 mg | ORAL_TABLET | Freq: Every day | ORAL | 1 refills | Status: DC
  Filled 2022-11-03: qty 90, 90d supply, fill #0
  Filled 2023-02-02: qty 90, 90d supply, fill #1

## 2022-11-07 ENCOUNTER — Other Ambulatory Visit (HOSPITAL_COMMUNITY): Payer: Self-pay

## 2022-11-11 ENCOUNTER — Other Ambulatory Visit (HOSPITAL_COMMUNITY): Payer: Self-pay

## 2022-11-11 ENCOUNTER — Other Ambulatory Visit (HOSPITAL_BASED_OUTPATIENT_CLINIC_OR_DEPARTMENT_OTHER): Payer: Self-pay

## 2022-11-11 MED ORDER — OZEMPIC (1 MG/DOSE) 4 MG/3ML ~~LOC~~ SOPN
1.0000 mg | PEN_INJECTOR | SUBCUTANEOUS | 3 refills | Status: DC
  Filled 2022-11-11 (×2): qty 3, 28d supply, fill #0
  Filled 2022-12-08: qty 3, 28d supply, fill #1
  Filled 2023-01-02: qty 3, 28d supply, fill #2
  Filled 2023-01-29: qty 3, 28d supply, fill #3

## 2022-11-24 ENCOUNTER — Other Ambulatory Visit (HOSPITAL_COMMUNITY): Payer: Self-pay

## 2022-11-24 ENCOUNTER — Other Ambulatory Visit: Payer: Self-pay

## 2022-11-27 ENCOUNTER — Other Ambulatory Visit (HOSPITAL_COMMUNITY): Payer: Self-pay

## 2022-11-27 ENCOUNTER — Other Ambulatory Visit: Payer: Self-pay

## 2022-12-02 ENCOUNTER — Other Ambulatory Visit (HOSPITAL_COMMUNITY): Payer: Self-pay

## 2022-12-08 ENCOUNTER — Other Ambulatory Visit: Payer: Self-pay

## 2023-01-02 ENCOUNTER — Other Ambulatory Visit (HOSPITAL_COMMUNITY): Payer: Self-pay

## 2023-01-02 ENCOUNTER — Other Ambulatory Visit: Payer: Self-pay

## 2023-01-27 ENCOUNTER — Other Ambulatory Visit (HOSPITAL_COMMUNITY): Payer: Self-pay

## 2023-01-28 ENCOUNTER — Other Ambulatory Visit: Payer: Self-pay

## 2023-01-29 ENCOUNTER — Other Ambulatory Visit: Payer: Self-pay

## 2023-01-29 ENCOUNTER — Other Ambulatory Visit (HOSPITAL_COMMUNITY): Payer: Self-pay

## 2023-02-02 ENCOUNTER — Other Ambulatory Visit (HOSPITAL_COMMUNITY): Payer: Self-pay

## 2023-02-03 ENCOUNTER — Other Ambulatory Visit (HOSPITAL_COMMUNITY): Payer: Self-pay

## 2023-02-03 MED ORDER — OMEPRAZOLE 40 MG PO CPDR
40.0000 mg | DELAYED_RELEASE_CAPSULE | Freq: Every day | ORAL | 3 refills | Status: DC
  Filled 2023-02-03: qty 90, 90d supply, fill #0
  Filled 2023-04-29: qty 90, 90d supply, fill #1
  Filled 2023-07-31: qty 90, 90d supply, fill #2
  Filled 2023-10-26: qty 90, 90d supply, fill #3

## 2023-02-07 ENCOUNTER — Other Ambulatory Visit (HOSPITAL_COMMUNITY): Payer: Self-pay

## 2023-02-09 ENCOUNTER — Other Ambulatory Visit (HOSPITAL_COMMUNITY): Payer: Self-pay

## 2023-02-09 MED ORDER — TAMSULOSIN HCL 0.4 MG PO CAPS
0.4000 mg | ORAL_CAPSULE | Freq: Every day | ORAL | 0 refills | Status: DC
  Filled 2023-02-09: qty 90, 90d supply, fill #0

## 2023-02-25 ENCOUNTER — Other Ambulatory Visit (HOSPITAL_COMMUNITY): Payer: Self-pay

## 2023-02-26 ENCOUNTER — Other Ambulatory Visit (HOSPITAL_COMMUNITY): Payer: Self-pay

## 2023-02-26 MED ORDER — METFORMIN HCL 500 MG PO TABS
1000.0000 mg | ORAL_TABLET | Freq: Two times a day (BID) | ORAL | 1 refills | Status: DC
  Filled 2023-02-26: qty 360, 90d supply, fill #0
  Filled 2023-04-17 – 2023-06-08 (×2): qty 360, 90d supply, fill #1

## 2023-02-27 ENCOUNTER — Other Ambulatory Visit (HOSPITAL_COMMUNITY): Payer: Self-pay

## 2023-02-27 ENCOUNTER — Other Ambulatory Visit: Payer: Self-pay

## 2023-02-27 MED ORDER — OZEMPIC (1 MG/DOSE) 4 MG/3ML ~~LOC~~ SOPN
1.0000 mg | PEN_INJECTOR | SUBCUTANEOUS | 3 refills | Status: DC
  Filled 2023-02-27: qty 3, 28d supply, fill #0
  Filled 2023-03-25: qty 3, 28d supply, fill #1
  Filled 2023-04-29 – 2023-05-05 (×2): qty 3, 28d supply, fill #2
  Filled 2023-06-08: qty 3, 28d supply, fill #3

## 2023-03-05 ENCOUNTER — Other Ambulatory Visit (HOSPITAL_COMMUNITY): Payer: Self-pay

## 2023-03-05 MED ORDER — OXYBUTYNIN CHLORIDE ER 5 MG PO TB24
5.0000 mg | ORAL_TABLET | Freq: Every day | ORAL | 0 refills | Status: DC
Start: 2023-03-05 — End: 2023-05-07
  Filled 2023-03-05: qty 90, 90d supply, fill #0

## 2023-03-06 ENCOUNTER — Other Ambulatory Visit: Payer: Self-pay

## 2023-03-25 ENCOUNTER — Other Ambulatory Visit (HOSPITAL_COMMUNITY): Payer: Self-pay

## 2023-03-26 DIAGNOSIS — R3915 Urgency of urination: Secondary | ICD-10-CM | POA: Diagnosis not present

## 2023-04-09 ENCOUNTER — Other Ambulatory Visit (HOSPITAL_COMMUNITY): Payer: Self-pay

## 2023-04-17 ENCOUNTER — Other Ambulatory Visit (HOSPITAL_COMMUNITY): Payer: Self-pay

## 2023-04-18 ENCOUNTER — Other Ambulatory Visit (HOSPITAL_COMMUNITY): Payer: Self-pay

## 2023-04-19 ENCOUNTER — Other Ambulatory Visit (HOSPITAL_COMMUNITY): Payer: Self-pay

## 2023-04-19 MED ORDER — TAMSULOSIN HCL 0.4 MG PO CAPS
0.4000 mg | ORAL_CAPSULE | Freq: Every day | ORAL | 0 refills | Status: DC
Start: 2023-04-19 — End: 2023-05-06
  Filled 2023-04-19 – 2023-04-21 (×2): qty 90, 90d supply, fill #0

## 2023-04-20 ENCOUNTER — Other Ambulatory Visit (HOSPITAL_COMMUNITY): Payer: Self-pay

## 2023-04-20 ENCOUNTER — Other Ambulatory Visit: Payer: Self-pay

## 2023-04-21 ENCOUNTER — Other Ambulatory Visit (HOSPITAL_COMMUNITY): Payer: Self-pay

## 2023-04-21 ENCOUNTER — Other Ambulatory Visit: Payer: Self-pay

## 2023-04-23 ENCOUNTER — Other Ambulatory Visit (HOSPITAL_COMMUNITY): Payer: Self-pay

## 2023-04-29 ENCOUNTER — Other Ambulatory Visit (HOSPITAL_COMMUNITY): Payer: Self-pay

## 2023-04-29 MED ORDER — ATORVASTATIN CALCIUM 20 MG PO TABS
20.0000 mg | ORAL_TABLET | Freq: Every day | ORAL | 1 refills | Status: DC
  Filled 2023-04-29: qty 90, 90d supply, fill #0
  Filled 2023-07-31: qty 90, 90d supply, fill #1

## 2023-04-30 ENCOUNTER — Other Ambulatory Visit (HOSPITAL_COMMUNITY): Payer: Self-pay

## 2023-05-01 ENCOUNTER — Other Ambulatory Visit (HOSPITAL_COMMUNITY): Payer: Self-pay

## 2023-05-05 ENCOUNTER — Other Ambulatory Visit (HOSPITAL_COMMUNITY): Payer: Self-pay

## 2023-05-06 MED ORDER — TAMSULOSIN HCL 0.4 MG PO CAPS
0.4000 mg | ORAL_CAPSULE | Freq: Every day | ORAL | 0 refills | Status: DC
Start: 2023-05-06 — End: 2023-11-27
  Filled 2023-05-06 – 2023-08-03 (×3): qty 90, 90d supply, fill #0

## 2023-05-07 ENCOUNTER — Other Ambulatory Visit (HOSPITAL_COMMUNITY): Payer: Self-pay

## 2023-05-07 ENCOUNTER — Other Ambulatory Visit: Payer: Self-pay

## 2023-05-07 MED ORDER — OXYBUTYNIN CHLORIDE ER 5 MG PO TB24
5.0000 mg | ORAL_TABLET | Freq: Every day | ORAL | 0 refills | Status: DC
  Filled 2023-05-07 – 2023-05-12 (×2): qty 90, 90d supply, fill #0

## 2023-05-12 ENCOUNTER — Other Ambulatory Visit (HOSPITAL_COMMUNITY): Payer: Self-pay

## 2023-05-12 ENCOUNTER — Other Ambulatory Visit: Payer: Self-pay

## 2023-05-15 ENCOUNTER — Other Ambulatory Visit (HOSPITAL_COMMUNITY): Payer: Self-pay

## 2023-05-15 DIAGNOSIS — R3915 Urgency of urination: Secondary | ICD-10-CM | POA: Diagnosis not present

## 2023-05-28 DIAGNOSIS — Z85820 Personal history of malignant melanoma of skin: Secondary | ICD-10-CM | POA: Diagnosis not present

## 2023-05-28 DIAGNOSIS — D224 Melanocytic nevi of scalp and neck: Secondary | ICD-10-CM | POA: Diagnosis not present

## 2023-05-28 DIAGNOSIS — L570 Actinic keratosis: Secondary | ICD-10-CM | POA: Diagnosis not present

## 2023-05-28 DIAGNOSIS — Z85828 Personal history of other malignant neoplasm of skin: Secondary | ICD-10-CM | POA: Diagnosis not present

## 2023-05-28 DIAGNOSIS — L821 Other seborrheic keratosis: Secondary | ICD-10-CM | POA: Diagnosis not present

## 2023-05-28 DIAGNOSIS — D225 Melanocytic nevi of trunk: Secondary | ICD-10-CM | POA: Diagnosis not present

## 2023-05-28 DIAGNOSIS — L905 Scar conditions and fibrosis of skin: Secondary | ICD-10-CM | POA: Diagnosis not present

## 2023-05-28 DIAGNOSIS — D2261 Melanocytic nevi of right upper limb, including shoulder: Secondary | ICD-10-CM | POA: Diagnosis not present

## 2023-05-28 DIAGNOSIS — D2262 Melanocytic nevi of left upper limb, including shoulder: Secondary | ICD-10-CM | POA: Diagnosis not present

## 2023-06-09 ENCOUNTER — Other Ambulatory Visit: Payer: Self-pay

## 2023-06-09 ENCOUNTER — Other Ambulatory Visit (HOSPITAL_COMMUNITY): Payer: Self-pay

## 2023-06-15 DIAGNOSIS — E7800 Pure hypercholesterolemia, unspecified: Secondary | ICD-10-CM | POA: Diagnosis not present

## 2023-06-15 DIAGNOSIS — E1121 Type 2 diabetes mellitus with diabetic nephropathy: Secondary | ICD-10-CM | POA: Diagnosis not present

## 2023-06-15 DIAGNOSIS — I129 Hypertensive chronic kidney disease with stage 1 through stage 4 chronic kidney disease, or unspecified chronic kidney disease: Secondary | ICD-10-CM | POA: Diagnosis not present

## 2023-06-16 DIAGNOSIS — R3915 Urgency of urination: Secondary | ICD-10-CM | POA: Diagnosis not present

## 2023-06-17 DIAGNOSIS — E1121 Type 2 diabetes mellitus with diabetic nephropathy: Secondary | ICD-10-CM | POA: Diagnosis not present

## 2023-06-17 DIAGNOSIS — I739 Peripheral vascular disease, unspecified: Secondary | ICD-10-CM | POA: Diagnosis not present

## 2023-06-17 DIAGNOSIS — I129 Hypertensive chronic kidney disease with stage 1 through stage 4 chronic kidney disease, or unspecified chronic kidney disease: Secondary | ICD-10-CM | POA: Diagnosis not present

## 2023-06-17 DIAGNOSIS — K219 Gastro-esophageal reflux disease without esophagitis: Secondary | ICD-10-CM | POA: Diagnosis not present

## 2023-06-17 DIAGNOSIS — Z0000 Encounter for general adult medical examination without abnormal findings: Secondary | ICD-10-CM | POA: Diagnosis not present

## 2023-06-17 DIAGNOSIS — E7800 Pure hypercholesterolemia, unspecified: Secondary | ICD-10-CM | POA: Diagnosis not present

## 2023-06-17 DIAGNOSIS — Z0189 Encounter for other specified special examinations: Secondary | ICD-10-CM | POA: Diagnosis not present

## 2023-06-17 DIAGNOSIS — E1151 Type 2 diabetes mellitus with diabetic peripheral angiopathy without gangrene: Secondary | ICD-10-CM | POA: Diagnosis not present

## 2023-06-17 DIAGNOSIS — R399 Unspecified symptoms and signs involving the genitourinary system: Secondary | ICD-10-CM | POA: Diagnosis not present

## 2023-06-17 DIAGNOSIS — Z8546 Personal history of malignant neoplasm of prostate: Secondary | ICD-10-CM | POA: Diagnosis not present

## 2023-07-03 ENCOUNTER — Other Ambulatory Visit (HOSPITAL_COMMUNITY): Payer: Self-pay

## 2023-07-06 ENCOUNTER — Other Ambulatory Visit (HOSPITAL_COMMUNITY): Payer: Self-pay

## 2023-07-06 MED ORDER — OZEMPIC (1 MG/DOSE) 4 MG/3ML ~~LOC~~ SOPN
1.0000 mg | PEN_INJECTOR | SUBCUTANEOUS | 3 refills | Status: DC
  Filled 2023-07-06: qty 3, 28d supply, fill #0
  Filled 2023-07-31: qty 3, 28d supply, fill #1
  Filled 2023-08-27: qty 3, 28d supply, fill #2
  Filled 2023-09-24: qty 3, 28d supply, fill #3

## 2023-07-21 DIAGNOSIS — N3941 Urge incontinence: Secondary | ICD-10-CM | POA: Diagnosis not present

## 2023-07-24 ENCOUNTER — Other Ambulatory Visit (HOSPITAL_COMMUNITY): Payer: Self-pay

## 2023-07-27 ENCOUNTER — Other Ambulatory Visit (HOSPITAL_COMMUNITY): Payer: Self-pay

## 2023-07-27 ENCOUNTER — Other Ambulatory Visit: Payer: Self-pay

## 2023-07-27 MED ORDER — LOSARTAN POTASSIUM-HCTZ 100-12.5 MG PO TABS
1.0000 | ORAL_TABLET | Freq: Every day | ORAL | 1 refills | Status: DC
  Filled 2023-07-27: qty 90, 90d supply, fill #0
  Filled 2023-10-26: qty 90, 90d supply, fill #1

## 2023-07-31 ENCOUNTER — Other Ambulatory Visit (HOSPITAL_COMMUNITY): Payer: Self-pay

## 2023-07-31 ENCOUNTER — Other Ambulatory Visit: Payer: Self-pay

## 2023-08-03 ENCOUNTER — Other Ambulatory Visit: Payer: Self-pay

## 2023-08-05 DIAGNOSIS — Z86018 Personal history of other benign neoplasm: Secondary | ICD-10-CM | POA: Diagnosis not present

## 2023-08-05 DIAGNOSIS — K219 Gastro-esophageal reflux disease without esophagitis: Secondary | ICD-10-CM | POA: Diagnosis not present

## 2023-08-18 DIAGNOSIS — R3915 Urgency of urination: Secondary | ICD-10-CM | POA: Diagnosis not present

## 2023-08-24 ENCOUNTER — Other Ambulatory Visit (HOSPITAL_COMMUNITY): Payer: Self-pay

## 2023-08-24 MED ORDER — BISACODYL 5 MG PO TBEC
DELAYED_RELEASE_TABLET | ORAL | 0 refills | Status: AC
  Filled 2023-08-24: qty 4, 2d supply, fill #0

## 2023-08-24 MED ORDER — PEG 3350-KCL-NA BICARB-NACL 420 G PO SOLR
ORAL | 0 refills | Status: AC
  Filled 2023-08-24: qty 4000, 1d supply, fill #0

## 2023-08-26 DIAGNOSIS — K648 Other hemorrhoids: Secondary | ICD-10-CM | POA: Diagnosis not present

## 2023-08-26 DIAGNOSIS — Z09 Encounter for follow-up examination after completed treatment for conditions other than malignant neoplasm: Secondary | ICD-10-CM | POA: Diagnosis not present

## 2023-08-26 DIAGNOSIS — K5520 Angiodysplasia of colon without hemorrhage: Secondary | ICD-10-CM | POA: Diagnosis not present

## 2023-08-26 DIAGNOSIS — K6389 Other specified diseases of intestine: Secondary | ICD-10-CM | POA: Diagnosis not present

## 2023-08-26 DIAGNOSIS — Z860101 Personal history of adenomatous and serrated colon polyps: Secondary | ICD-10-CM | POA: Diagnosis not present

## 2023-08-26 DIAGNOSIS — D123 Benign neoplasm of transverse colon: Secondary | ICD-10-CM | POA: Diagnosis not present

## 2023-08-26 DIAGNOSIS — K5730 Diverticulosis of large intestine without perforation or abscess without bleeding: Secondary | ICD-10-CM | POA: Diagnosis not present

## 2023-08-27 ENCOUNTER — Other Ambulatory Visit: Payer: Self-pay

## 2023-08-28 DIAGNOSIS — D123 Benign neoplasm of transverse colon: Secondary | ICD-10-CM | POA: Diagnosis not present

## 2023-08-28 DIAGNOSIS — K6389 Other specified diseases of intestine: Secondary | ICD-10-CM | POA: Diagnosis not present

## 2023-09-01 ENCOUNTER — Other Ambulatory Visit: Payer: Self-pay

## 2023-09-01 ENCOUNTER — Other Ambulatory Visit (HOSPITAL_COMMUNITY): Payer: Self-pay

## 2023-09-01 MED ORDER — METFORMIN HCL 500 MG PO TABS
1000.0000 mg | ORAL_TABLET | Freq: Two times a day (BID) | ORAL | 1 refills | Status: AC
  Filled 2023-09-01: qty 360, 90d supply, fill #0
  Filled 2023-12-14: qty 360, 90d supply, fill #1

## 2023-09-02 DIAGNOSIS — K648 Other hemorrhoids: Secondary | ICD-10-CM | POA: Diagnosis not present

## 2023-09-02 DIAGNOSIS — K6389 Other specified diseases of intestine: Secondary | ICD-10-CM | POA: Diagnosis not present

## 2023-09-02 DIAGNOSIS — Z09 Encounter for follow-up examination after completed treatment for conditions other than malignant neoplasm: Secondary | ICD-10-CM | POA: Diagnosis not present

## 2023-09-02 DIAGNOSIS — Z860101 Personal history of adenomatous and serrated colon polyps: Secondary | ICD-10-CM | POA: Diagnosis not present

## 2023-09-02 DIAGNOSIS — K5730 Diverticulosis of large intestine without perforation or abscess without bleeding: Secondary | ICD-10-CM | POA: Diagnosis not present

## 2023-09-02 DIAGNOSIS — K5520 Angiodysplasia of colon without hemorrhage: Secondary | ICD-10-CM | POA: Diagnosis not present

## 2023-09-02 DIAGNOSIS — D123 Benign neoplasm of transverse colon: Secondary | ICD-10-CM | POA: Diagnosis not present

## 2023-09-05 ENCOUNTER — Other Ambulatory Visit (HOSPITAL_COMMUNITY): Payer: Self-pay

## 2023-09-07 ENCOUNTER — Other Ambulatory Visit: Payer: Self-pay

## 2023-09-07 ENCOUNTER — Other Ambulatory Visit (HOSPITAL_COMMUNITY): Payer: Self-pay

## 2023-09-07 MED ORDER — OXYBUTYNIN CHLORIDE ER 5 MG PO TB24
5.0000 mg | ORAL_TABLET | Freq: Every day | ORAL | 0 refills | Status: DC
  Filled 2023-09-07: qty 90, 90d supply, fill #0

## 2023-09-15 DIAGNOSIS — R3915 Urgency of urination: Secondary | ICD-10-CM | POA: Diagnosis not present

## 2023-09-24 ENCOUNTER — Other Ambulatory Visit (HOSPITAL_COMMUNITY): Payer: Self-pay

## 2023-10-13 DIAGNOSIS — R3915 Urgency of urination: Secondary | ICD-10-CM | POA: Diagnosis not present

## 2023-10-26 ENCOUNTER — Other Ambulatory Visit (HOSPITAL_COMMUNITY): Payer: Self-pay

## 2023-10-26 ENCOUNTER — Other Ambulatory Visit: Payer: Self-pay

## 2023-10-26 MED ORDER — OZEMPIC (1 MG/DOSE) 4 MG/3ML ~~LOC~~ SOPN
1.0000 mg | PEN_INJECTOR | SUBCUTANEOUS | 3 refills | Status: DC
  Filled 2023-10-26: qty 3, 28d supply, fill #0
  Filled 2023-11-18: qty 3, 28d supply, fill #1
  Filled 2023-12-16: qty 3, 28d supply, fill #2
  Filled 2024-01-13: qty 3, 28d supply, fill #3

## 2023-10-26 MED ORDER — ATORVASTATIN CALCIUM 20 MG PO TABS
20.0000 mg | ORAL_TABLET | Freq: Every day | ORAL | 1 refills | Status: AC
  Filled 2023-10-26: qty 90, 90d supply, fill #0
  Filled 2024-01-22: qty 90, 90d supply, fill #1

## 2023-10-27 ENCOUNTER — Other Ambulatory Visit: Payer: Self-pay

## 2023-11-08 ENCOUNTER — Other Ambulatory Visit (HOSPITAL_COMMUNITY): Payer: Self-pay

## 2023-11-10 DIAGNOSIS — R350 Frequency of micturition: Secondary | ICD-10-CM | POA: Diagnosis not present

## 2023-11-17 ENCOUNTER — Other Ambulatory Visit (HOSPITAL_COMMUNITY): Payer: Self-pay

## 2023-11-18 ENCOUNTER — Other Ambulatory Visit: Payer: Self-pay

## 2023-11-18 ENCOUNTER — Other Ambulatory Visit (HOSPITAL_COMMUNITY): Payer: Self-pay

## 2023-11-21 ENCOUNTER — Other Ambulatory Visit (HOSPITAL_COMMUNITY): Payer: Self-pay

## 2023-11-21 ENCOUNTER — Encounter (HOSPITAL_COMMUNITY): Payer: Self-pay

## 2023-11-23 ENCOUNTER — Other Ambulatory Visit (HOSPITAL_COMMUNITY): Payer: Self-pay

## 2023-11-23 ENCOUNTER — Other Ambulatory Visit: Payer: Self-pay

## 2023-11-23 MED ORDER — TAMSULOSIN HCL 0.4 MG PO CAPS
0.4000 mg | ORAL_CAPSULE | Freq: Every day | ORAL | 0 refills | Status: AC
  Filled 2023-11-23: qty 30, 30d supply, fill #0

## 2023-11-27 ENCOUNTER — Other Ambulatory Visit: Payer: Self-pay

## 2023-11-27 ENCOUNTER — Other Ambulatory Visit (HOSPITAL_COMMUNITY): Payer: Self-pay

## 2023-11-27 DIAGNOSIS — R3915 Urgency of urination: Secondary | ICD-10-CM | POA: Diagnosis not present

## 2023-11-27 DIAGNOSIS — C61 Malignant neoplasm of prostate: Secondary | ICD-10-CM | POA: Diagnosis not present

## 2023-11-27 DIAGNOSIS — R399 Unspecified symptoms and signs involving the genitourinary system: Secondary | ICD-10-CM | POA: Diagnosis not present

## 2023-11-27 DIAGNOSIS — N3941 Urge incontinence: Secondary | ICD-10-CM | POA: Diagnosis not present

## 2023-11-27 DIAGNOSIS — R350 Frequency of micturition: Secondary | ICD-10-CM | POA: Diagnosis not present

## 2023-11-27 MED ORDER — OXYBUTYNIN CHLORIDE ER 5 MG PO TB24
5.0000 mg | ORAL_TABLET | Freq: Every day | ORAL | 3 refills | Status: AC
  Filled 2023-11-27: qty 90, 90d supply, fill #0

## 2023-11-27 MED ORDER — TAMSULOSIN HCL 0.4 MG PO CAPS
0.4000 mg | ORAL_CAPSULE | Freq: Every day | ORAL | 3 refills | Status: AC
  Filled 2023-12-14 – 2023-12-16 (×2): qty 90, 90d supply, fill #0

## 2023-12-03 DIAGNOSIS — L570 Actinic keratosis: Secondary | ICD-10-CM | POA: Diagnosis not present

## 2023-12-03 DIAGNOSIS — L820 Inflamed seborrheic keratosis: Secondary | ICD-10-CM | POA: Diagnosis not present

## 2023-12-03 DIAGNOSIS — L821 Other seborrheic keratosis: Secondary | ICD-10-CM | POA: Diagnosis not present

## 2023-12-03 DIAGNOSIS — Z85820 Personal history of malignant melanoma of skin: Secondary | ICD-10-CM | POA: Diagnosis not present

## 2023-12-03 DIAGNOSIS — Z85828 Personal history of other malignant neoplasm of skin: Secondary | ICD-10-CM | POA: Diagnosis not present

## 2023-12-14 ENCOUNTER — Other Ambulatory Visit: Payer: Self-pay

## 2023-12-14 ENCOUNTER — Other Ambulatory Visit (HOSPITAL_COMMUNITY): Payer: Self-pay

## 2023-12-15 DIAGNOSIS — R350 Frequency of micturition: Secondary | ICD-10-CM | POA: Diagnosis not present

## 2023-12-16 ENCOUNTER — Other Ambulatory Visit: Payer: Self-pay

## 2023-12-16 ENCOUNTER — Other Ambulatory Visit (HOSPITAL_COMMUNITY): Payer: Self-pay

## 2023-12-21 DIAGNOSIS — E1121 Type 2 diabetes mellitus with diabetic nephropathy: Secondary | ICD-10-CM | POA: Diagnosis not present

## 2023-12-21 DIAGNOSIS — E7800 Pure hypercholesterolemia, unspecified: Secondary | ICD-10-CM | POA: Diagnosis not present

## 2023-12-21 DIAGNOSIS — I129 Hypertensive chronic kidney disease with stage 1 through stage 4 chronic kidney disease, or unspecified chronic kidney disease: Secondary | ICD-10-CM | POA: Diagnosis not present

## 2023-12-21 DIAGNOSIS — K219 Gastro-esophageal reflux disease without esophagitis: Secondary | ICD-10-CM | POA: Diagnosis not present

## 2024-01-13 ENCOUNTER — Other Ambulatory Visit (HOSPITAL_COMMUNITY): Payer: Self-pay

## 2024-01-13 ENCOUNTER — Other Ambulatory Visit: Payer: Self-pay

## 2024-01-22 ENCOUNTER — Other Ambulatory Visit (HOSPITAL_COMMUNITY): Payer: Self-pay

## 2024-01-22 ENCOUNTER — Other Ambulatory Visit: Payer: Self-pay

## 2024-01-22 MED ORDER — OMEPRAZOLE 40 MG PO CPDR
40.0000 mg | DELAYED_RELEASE_CAPSULE | Freq: Every day | ORAL | 3 refills | Status: AC
  Filled 2024-01-22: qty 90, 90d supply, fill #0

## 2024-01-25 ENCOUNTER — Other Ambulatory Visit (HOSPITAL_COMMUNITY): Payer: Self-pay

## 2024-01-25 MED ORDER — LOSARTAN POTASSIUM-HCTZ 100-12.5 MG PO TABS
1.0000 | ORAL_TABLET | Freq: Every day | ORAL | 1 refills | Status: AC
  Filled 2024-01-25: qty 90, 90d supply, fill #0

## 2024-02-13 ENCOUNTER — Other Ambulatory Visit (HOSPITAL_COMMUNITY): Payer: Self-pay

## 2024-02-15 ENCOUNTER — Other Ambulatory Visit (HOSPITAL_COMMUNITY): Payer: Self-pay

## 2024-02-15 ENCOUNTER — Other Ambulatory Visit: Payer: Self-pay

## 2024-02-15 MED ORDER — OZEMPIC (1 MG/DOSE) 4 MG/3ML ~~LOC~~ SOPN
1.0000 mg | PEN_INJECTOR | SUBCUTANEOUS | 3 refills | Status: AC
  Filled 2024-02-15: qty 3, 28d supply, fill #0
